# Patient Record
Sex: Female | Born: 1990 | Race: White | Hispanic: No | Marital: Single | Smoking: Former smoker
Health system: Southern US, Community
[De-identification: ages and names within clinical notes are randomized; demographics above are authoritative.]

## PROBLEM LIST (undated history)

## (undated) ENCOUNTER — Inpatient Hospital Stay (HOSPITAL_COMMUNITY): Payer: Self-pay

## (undated) DIAGNOSIS — Z789 Other specified health status: Secondary | ICD-10-CM

## (undated) HISTORY — PX: NO PAST SURGERIES: SHX2092

---
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
## 2008-05-30 ENCOUNTER — Emergency Department (HOSPITAL_COMMUNITY): Admission: EM | Admit: 2008-05-30 | Discharge: 2008-05-30 | Payer: Self-pay | Admitting: Emergency Medicine

## 2009-04-13 ENCOUNTER — Emergency Department (HOSPITAL_COMMUNITY): Admission: EM | Admit: 2009-04-13 | Discharge: 2009-04-13 | Payer: Self-pay | Admitting: Emergency Medicine

## 2010-01-03 ENCOUNTER — Emergency Department (HOSPITAL_COMMUNITY): Admission: EM | Admit: 2010-01-03 | Discharge: 2010-01-03 | Payer: Self-pay | Admitting: Emergency Medicine

## 2010-09-02 ENCOUNTER — Emergency Department (HOSPITAL_COMMUNITY)
Admission: EM | Admit: 2010-09-02 | Discharge: 2010-09-02 | Disposition: A | Discharge status: Home / Self Care | Payer: Medicaid Other | Attending: Emergency Medicine | Admitting: Emergency Medicine

## 2010-09-02 ENCOUNTER — Emergency Department (HOSPITAL_COMMUNITY): Payer: Medicaid Other

## 2010-09-02 DIAGNOSIS — R109 Unspecified abdominal pain: Secondary | ICD-10-CM | POA: Insufficient documentation

## 2010-09-02 DIAGNOSIS — O99891 Other specified diseases and conditions complicating pregnancy: Secondary | ICD-10-CM | POA: Insufficient documentation

## 2010-09-02 DIAGNOSIS — R110 Nausea: Secondary | ICD-10-CM | POA: Insufficient documentation

## 2010-09-02 LAB — URINALYSIS, ROUTINE W REFLEX MICROSCOPIC
Nitrite: NEGATIVE
Specific Gravity, Urine: 1.025 (ref 1.005–1.030)
pH: 5 (ref 5.0–8.0)

## 2010-09-02 LAB — WET PREP, GENITAL
Trich, Wet Prep: NONE SEEN
Yeast Wet Prep HPF POC: NONE SEEN

## 2010-09-02 LAB — PREGNANCY, URINE

## 2010-10-16 ENCOUNTER — Emergency Department (HOSPITAL_COMMUNITY)
Admission: EM | Admit: 2010-10-16 | Discharge: 2010-10-16 | Disposition: A | Discharge status: Home / Self Care | Payer: Medicaid Other | Attending: Emergency Medicine | Admitting: Emergency Medicine

## 2010-10-16 DIAGNOSIS — O9989 Other specified diseases and conditions complicating pregnancy, childbirth and the puerperium: Secondary | ICD-10-CM | POA: Insufficient documentation

## 2010-10-16 DIAGNOSIS — J45909 Unspecified asthma, uncomplicated: Secondary | ICD-10-CM | POA: Insufficient documentation

## 2010-10-16 DIAGNOSIS — R1084 Generalized abdominal pain: Secondary | ICD-10-CM | POA: Insufficient documentation

## 2010-10-16 DIAGNOSIS — R110 Nausea: Secondary | ICD-10-CM | POA: Insufficient documentation

## 2010-10-16 LAB — URINALYSIS, ROUTINE W REFLEX MICROSCOPIC
Glucose, UA: NEGATIVE mg/dL
Protein, ur: NEGATIVE mg/dL
pH: 5.5 (ref 5.0–8.0)

## 2011-03-30 ENCOUNTER — Other Ambulatory Visit: Payer: Self-pay | Admitting: Obstetrics and Gynecology

## 2011-03-30 ENCOUNTER — Encounter (HOSPITAL_COMMUNITY): Payer: Self-pay | Admitting: *Deleted

## 2011-03-30 ENCOUNTER — Inpatient Hospital Stay (HOSPITAL_COMMUNITY)
Admission: AD | Admit: 2011-03-30 | Discharge: 2011-04-02 | DRG: 775 | Disposition: A | Discharge status: Home / Self Care | Payer: Medicaid Other | Source: Ambulatory Visit | Attending: Obstetrics and Gynecology | Admitting: Obstetrics and Gynecology

## 2011-03-30 DIAGNOSIS — O4290 Premature rupture of membranes, unspecified as to length of time between rupture and onset of labor, unspecified weeks of gestation: Principal | ICD-10-CM | POA: Diagnosis present

## 2011-03-30 DIAGNOSIS — O42919 Preterm premature rupture of membranes, unspecified as to length of time between rupture and onset of labor, unspecified trimester: Secondary | ICD-10-CM

## 2011-03-30 HISTORY — DX: Other specified health status: Z78.9

## 2011-03-30 LAB — CBC
Platelets: 488 10*3/uL — ABNORMAL HIGH (ref 150–400)
RBC: 3.7 MIL/uL — ABNORMAL LOW (ref 3.87–5.11)
WBC: 17.8 10*3/uL — ABNORMAL HIGH (ref 4.0–10.5)

## 2011-03-30 LAB — HEPATITIS B SURFACE ANTIGEN: Hepatitis B Surface Ag: NEGATIVE

## 2011-03-30 LAB — HIV ANTIBODY (ROUTINE TESTING W REFLEX): HIV: NONREACTIVE

## 2011-03-30 LAB — ABO/RH: RH Type: POSITIVE

## 2011-03-30 MED ORDER — EPHEDRINE 5 MG/ML INJ: 10.0000 mg | INTRAVENOUS | Status: DC | PRN

## 2011-03-30 MED ORDER — PHENYLEPHRINE 40 MCG/ML (10ML) SYRINGE FOR IV PUSH (FOR BLOOD PRESSURE SUPPORT): 80.0000 ug | PREFILLED_SYRINGE | INTRAVENOUS | Status: DC | PRN

## 2011-03-30 MED ORDER — ONDANSETRON HCL 4 MG/2ML IJ SOLN: 4.0000 mg | Freq: Four times a day (QID) | INTRAMUSCULAR | Status: DC | PRN

## 2011-03-30 MED ORDER — OXYCODONE-ACETAMINOPHEN 5-325 MG PO TABS: 2.0000 | ORAL_TABLET | ORAL | Status: DC | PRN

## 2011-03-30 MED ORDER — LACTATED RINGERS IV SOLN
INTRAVENOUS | Status: DC
  Administered 2011-03-30 – 2011-03-31 (×3): via INTRAVENOUS

## 2011-03-30 MED ORDER — PENICILLIN G POTASSIUM 5000000 UNITS IJ SOLR
5.0000 10*6.[IU] | Freq: Once | INTRAVENOUS | Status: AC
  Administered 2011-03-30: 5 10*6.[IU] via INTRAVENOUS
  Filled 2011-03-30: qty 5

## 2011-03-30 MED ORDER — EPHEDRINE 5 MG/ML INJ
10.0000 mg | INTRAVENOUS | Status: DC | PRN
  Filled 2011-03-30: qty 4

## 2011-03-30 MED ORDER — OXYTOCIN BOLUS FROM INFUSION
500.0000 mL | Freq: Once | INTRAVENOUS | Status: DC
  Filled 2011-03-30: qty 500

## 2011-03-30 MED ORDER — PHENYLEPHRINE 40 MCG/ML (10ML) SYRINGE FOR IV PUSH (FOR BLOOD PRESSURE SUPPORT)
80.0000 ug | PREFILLED_SYRINGE | INTRAVENOUS | Status: DC | PRN
  Filled 2011-03-30: qty 5

## 2011-03-30 MED ORDER — TERBUTALINE SULFATE 1 MG/ML IJ SOLN: 0.2500 mg | Freq: Once | INTRAMUSCULAR | Status: AC | PRN

## 2011-03-30 MED ORDER — LIDOCAINE HCL (PF) 1 % IJ SOLN
30.0000 mL | INTRAMUSCULAR | Status: DC | PRN
  Filled 2011-03-30: qty 30

## 2011-03-30 MED ORDER — OXYTOCIN 20 UNITS IN LACTATED RINGERS INFUSION - SIMPLE
125.0000 mL/h | Freq: Once | INTRAVENOUS | Status: AC
  Administered 2011-03-31: 999 mL/h via INTRAVENOUS

## 2011-03-30 MED ORDER — OXYTOCIN 20 UNITS IN LACTATED RINGERS INFUSION - SIMPLE
1.0000 m[IU]/min | INTRAVENOUS | Status: DC
  Administered 2011-03-30: 3 m[IU]/min via INTRAVENOUS
  Administered 2011-03-30: 1 m[IU]/min via INTRAVENOUS
  Filled 2011-03-30: qty 1000

## 2011-03-30 MED ORDER — CITRIC ACID-SODIUM CITRATE 334-500 MG/5ML PO SOLN: 30.0000 mL | ORAL | Status: DC | PRN

## 2011-03-30 MED ORDER — ACETAMINOPHEN 325 MG PO TABS: 650.0000 mg | ORAL_TABLET | ORAL | Status: DC | PRN

## 2011-03-30 MED ORDER — FENTANYL 2.5 MCG/ML BUPIVACAINE 1/10 % EPIDURAL INFUSION (WH - ANES)
14.0000 mL/h | INTRAMUSCULAR | Status: DC
  Administered 2011-03-31 (×2): 14 mL/h via EPIDURAL
  Filled 2011-03-30 (×2): qty 60

## 2011-03-30 MED ORDER — DIPHENHYDRAMINE HCL 50 MG/ML IJ SOLN: 12.5000 mg | INTRAMUSCULAR | Status: DC | PRN

## 2011-03-30 MED ORDER — PENICILLIN G POTASSIUM 5000000 UNITS IJ SOLR
2.5000 10*6.[IU] | INTRAMUSCULAR | Status: DC
  Administered 2011-03-30 – 2011-03-31 (×2): 2.5 10*6.[IU] via INTRAVENOUS
  Filled 2011-03-30 (×6): qty 2.5

## 2011-03-30 MED ORDER — LACTATED RINGERS IV SOLN
500.0000 mL | INTRAVENOUS | Status: DC | PRN
  Administered 2011-03-30: 500 mL via INTRAVENOUS

## 2011-03-30 MED ORDER — LACTATED RINGERS IV SOLN
500.0000 mL | Freq: Once | INTRAVENOUS | Status: AC
  Administered 2011-03-31: 500 mL via INTRAVENOUS

## 2011-03-30 MED ORDER — IBUPROFEN 600 MG PO TABS: 600.0000 mg | ORAL_TABLET | Freq: Four times a day (QID) | ORAL | Status: DC | PRN

## 2011-03-30 NOTE — Consult Note (Addendum)
Asked to provide prenatal consultation for patient being induced at 35.[redacted] wks EGA after onset preterm labor and PROM earlier today. Mother is 22 y.o. G2 P0 O pos GBS unknown, pregnancy previously uncomplicated. She is being treated with PCN (for unknown GBS).  Discussed with father of baby and several other family members/visitors present.  Presented usual expectations for preterm infant at 35 weeks, including possible need for NICU support, but also possibility for routine well baby care if no problems with breathing, feeding, temp stability, etc.  She does not plan to breast feed.  If NICU needed I predicted possible length of stay in NICU until 36 - [redacted] wks EGA.  Patient was attentive, had appropriate questions, and was appreciative of my input.  Thank you for the consultation.

## 2011-03-30 NOTE — H&P (Signed)
Abigail Morgan is a 20 y.o. female, G2 P0010, EGA [redacted] weeks presenting for leaking fluid.  Started leaking around 1300 today.  Evaluation in the office with + pool, + nitrazine, + fern.  Prenatal care complicated by chlamydia x 2, last rxed 10-15.  See prenatal records for complete history.  Maternal Medical History:  Reason for admission: Reason for admission: rupture of membranes.  Contractions: Frequency: irregular.    Fetal activity: Perceived fetal activity is normal.      OB History    Grav Para Term Preterm Abortions TAB SAB Ect Mult Living   2 0 0 0 1 1 0 0 0 0      Past Medical History  Diagnosis Date  . No pertinent past medical history    Past Surgical History  Procedure Date  . No past surgeries    Family History: family history is not on file. Social History:  reports that she quit smoking about 4 weeks ago. She has never used smokeless tobacco. She reports that she does not drink alcohol or use illicit drugs.  Review of Systems  Respiratory: Negative.   Cardiovascular: Negative.    VE-1-2/50/-2, vtx by RN, c/w my exam in the office Dilation:  (adjusted) Effacement (%): 50 Station: -2 Exam by:: m wilkins rnc Blood pressure 103/51, pulse 83, temperature 98 F (36.7 C), temperature source Oral, resp. rate 20, height 5' (1.524 m), weight 58.514 kg (129 lb), last menstrual period 07/25/2010. Maternal Exam:  Uterine Assessment: Contraction strength is moderate.  Contraction frequency is regular.   Abdomen: Patient reports no abdominal tenderness. Estimated fetal weight is 5 lbs.   Fetal presentation: vertex  Introitus: Normal vulva. Normal vagina.    Fetal Exam Fetal Monitor Review: Mode: ultrasound.   Baseline rate: 140s.  Variability: moderate (6-25 bpm).   Pattern: accelerations present and variable decelerations.    Fetal State Assessment: Category II - tracings are indeterminate.     Physical Exam  Constitutional: She appears well-developed and  well-nourished.  Neck: Neck supple. No thyromegaly present.  Cardiovascular: Normal rate and regular rhythm.   Murmur: II/VI systolic murmur. Respiratory: Breath sounds normal. No respiratory distress. She has no wheezes.  GI: Soft.    Prenatal labs: ABO, Rh: O/Positive/-- (11/07 0000) Antibody: Negative (11/07 0000) Rubella: Immune (11/07 0000) RPR: Nonreactive (11/07 0000)  HBsAg: Negative (11/07 0000)  HIV: Non-reactive (11/07 0000)  GBS: Unknown (11/07 0000)   Assessment/Plan: IUP at 35 weeks with PPROM.  On pitocin, on PCN for unknown GBS.  Will monitor progress.   Nicolaus Andel D 03/30/2011, 6:15 PM

## 2011-03-31 ENCOUNTER — Encounter (HOSPITAL_COMMUNITY): Payer: Self-pay

## 2011-03-31 ENCOUNTER — Encounter (HOSPITAL_COMMUNITY): Payer: Self-pay | Admitting: Anesthesiology

## 2011-03-31 ENCOUNTER — Inpatient Hospital Stay (HOSPITAL_COMMUNITY): Payer: Medicaid Other | Admitting: Anesthesiology

## 2011-03-31 ENCOUNTER — Other Ambulatory Visit: Payer: Self-pay | Admitting: Obstetrics and Gynecology

## 2011-03-31 LAB — RPR: RPR Ser Ql: NONREACTIVE

## 2011-03-31 LAB — ABO/RH: ABO/RH(D): O POS

## 2011-03-31 MED ORDER — WITCH HAZEL-GLYCERIN EX PADS: 1.0000 "application " | MEDICATED_PAD | CUTANEOUS | Status: DC | PRN

## 2011-03-31 MED ORDER — METHYLERGONOVINE MALEATE 0.2 MG PO TABS: 0.2000 mg | ORAL_TABLET | ORAL | Status: DC | PRN

## 2011-03-31 MED ORDER — IBUPROFEN 600 MG PO TABS
600.0000 mg | ORAL_TABLET | Freq: Four times a day (QID) | ORAL | Status: DC
  Administered 2011-03-31 – 2011-04-02 (×8): 600 mg via ORAL
  Filled 2011-03-31 (×8): qty 1

## 2011-03-31 MED ORDER — LIDOCAINE HCL 1.5 % IJ SOLN
INTRAMUSCULAR | Status: DC | PRN
  Administered 2011-03-31 (×2): 5 mL via EPIDURAL

## 2011-03-31 MED ORDER — BENZOCAINE-MENTHOL 20-0.5 % EX AERO: 1.0000 "application " | INHALATION_SPRAY | CUTANEOUS | Status: DC | PRN

## 2011-03-31 MED ORDER — SENNOSIDES-DOCUSATE SODIUM 8.6-50 MG PO TABS
2.0000 | ORAL_TABLET | Freq: Every day | ORAL | Status: DC
  Administered 2011-03-31 – 2011-04-01 (×2): 2 via ORAL

## 2011-03-31 MED ORDER — ONDANSETRON HCL 4 MG/2ML IJ SOLN: 4.0000 mg | INTRAMUSCULAR | Status: DC | PRN

## 2011-03-31 MED ORDER — ZOLPIDEM TARTRATE 5 MG PO TABS: 5.0000 mg | ORAL_TABLET | Freq: Every evening | ORAL | Status: DC | PRN

## 2011-03-31 MED ORDER — TETANUS-DIPHTH-ACELL PERTUSSIS 5-2.5-18.5 LF-MCG/0.5 IM SUSP
0.5000 mL | Freq: Once | INTRAMUSCULAR | Status: AC
  Administered 2011-04-01: 0.5 mL via INTRAMUSCULAR
  Filled 2011-03-31: qty 0.5

## 2011-03-31 MED ORDER — MEASLES, MUMPS & RUBELLA VAC ~~LOC~~ INJ
0.5000 mL | INJECTION | Freq: Once | SUBCUTANEOUS | Status: DC
  Filled 2011-03-31: qty 0.5

## 2011-03-31 MED ORDER — METHYLERGONOVINE MALEATE 0.2 MG/ML IJ SOLN: 0.2000 mg | INTRAMUSCULAR | Status: DC | PRN

## 2011-03-31 MED ORDER — OXYCODONE-ACETAMINOPHEN 5-325 MG PO TABS
1.0000 | ORAL_TABLET | ORAL | Status: DC | PRN
Start: 2011-03-31 — End: 2011-04-02
  Administered 2011-03-31: 1 via ORAL
  Administered 2011-04-01 (×2): 2 via ORAL
  Administered 2011-04-01: 1 via ORAL
  Filled 2011-03-31: qty 2
  Filled 2011-03-31 (×2): qty 1
  Filled 2011-03-31: qty 2

## 2011-03-31 MED ORDER — DIBUCAINE 1 % RE OINT: 1.0000 "application " | TOPICAL_OINTMENT | RECTAL | Status: DC | PRN

## 2011-03-31 MED ORDER — PRENATAL PLUS 27-1 MG PO TABS
1.0000 | ORAL_TABLET | Freq: Every day | ORAL | Status: DC
  Administered 2011-04-01: 1 via ORAL
  Filled 2011-03-31: qty 1

## 2011-03-31 MED ORDER — DIPHENHYDRAMINE HCL 25 MG PO CAPS: 25.0000 mg | ORAL_CAPSULE | Freq: Four times a day (QID) | ORAL | Status: DC | PRN

## 2011-03-31 MED ORDER — ONDANSETRON HCL 4 MG PO TABS: 4.0000 mg | ORAL_TABLET | ORAL | Status: DC | PRN

## 2011-03-31 MED ORDER — SIMETHICONE 80 MG PO CHEW: 80.0000 mg | CHEWABLE_TABLET | ORAL | Status: DC | PRN

## 2011-03-31 MED ORDER — MAGNESIUM HYDROXIDE 400 MG/5ML PO SUSP: 30.0000 mL | ORAL | Status: DC | PRN

## 2011-03-31 MED ORDER — OXYTOCIN 20 UNITS IN LACTATED RINGERS INFUSION - SIMPLE: 125.0000 mL/h | INTRAVENOUS | Status: DC | PRN

## 2011-03-31 MED ORDER — LANOLIN HYDROUS EX OINT: TOPICAL_OINTMENT | CUTANEOUS | Status: DC | PRN

## 2011-03-31 NOTE — Anesthesia Postprocedure Evaluation (Signed)
  Anesthesia Post-op Note  Patient: Abigail Morgan  Procedure(s) Performed: * No procedures listed *  Patient Location: Mother/Baby  Anesthesia Type: Epidural  Level of Consciousness: awake  Airway and Oxygen Therapy: Patient Spontanous Breathing  Post-op Pain: none  Post-op Assessment: Patient's Cardiovascular Status Stable, Respiratory Function Stable, Patent Airway, No signs of Nausea or vomiting, Adequate PO intake and Pain level controlled  Post-op Vital Signs: Reviewed and stable  Complications: No apparent anesthesia complications

## 2011-03-31 NOTE — Op Note (Signed)
Pt pushed well and brought the vertex down. She delivered spontaneously ROA over an intact perineum a living female infant 5 pounds 5 ounces. The NICU team was in attendance but did not state the Apgars. The placenta was delivered intact after cord blood collection was done. The uterus was normal There was no perineal laceration but there was a right labial laceration that was hemostatic and not repaired.Marland KitchenEBL 400 cc's.

## 2011-03-31 NOTE — Consult Note (Cosign Needed)
Called to attend 35 2/7 week delivery, MOB ruptured 20 hours with unknown GBS, received antibiotic therapy. Mom 20 y.o. female, G2 P51, labor was augmented.  Vaginal delivery in vertex position. The baby was active and crying when delivered to the team.  Dried and bulb suctioned, no O2 required. Taken to [facility] after 5 minute APGAR assigned for routine newborn care.

## 2011-03-31 NOTE — Progress Notes (Signed)
Pt was 3 cm at 6 AM and at 7:06 AM WAS 8-9 CM PER THE RN  Exam She was fully dilated at 7:20 AM and has begun pushing.Vertex is visible with depressing the perineum.

## 2011-03-31 NOTE — Anesthesia Postprocedure Evaluation (Signed)
  Anesthesia Post-op Note  Patient: Abigail Morgan  Procedure(s) Performed: * No procedures listed *  Patient Location: Mother/Baby  Anesthesia Type: Epidural  Level of Consciousness: awake  Airway and Oxygen Therapy: Patient Spontanous Breathing  Post-op Pain: none  Post-op Assessment: Patient's Cardiovascular Status Stable, Respiratory Function Stable, Adequate PO intake and Pain level controlled  Post-op Vital Signs: Reviewed and stable  Complications: No apparent anesthesia complications

## 2011-03-31 NOTE — Anesthesia Procedure Notes (Signed)
Epidural Patient location during procedure: OB Start time: 03/31/2011 2:48 AM  Staffing Performed by: anesthesiologist   Preanesthetic Checklist Completed: patient identified, site marked, surgical consent, pre-op evaluation, timeout performed, IV checked, risks and benefits discussed and monitors and equipment checked  Epidural Patient position: sitting Prep: site prepped and draped and DuraPrep Patient monitoring: continuous pulse ox and blood pressure Approach: midline Injection technique: LOR air and LOR saline  Needle:  Needle type: Tuohy  Needle gauge: 17 G Needle length: 9 cm Needle insertion depth: 5 cm cm Catheter type: closed end flexible Catheter size: 19 Gauge Catheter at skin depth: 10 cm Test dose: negative  Assessment Events: blood not aspirated, injection not painful, no injection resistance, negative IV test and no paresthesia  Additional Notes Patient identified.  Risk benefits discussed including failed block, incomplete pain control, headache, nerve damage, paralysis, blood pressure changes, nausea, vomiting, reactions to medication both toxic or allergic, and postpartum back pain.  Patient expressed understanding and wished to proceed.  All questions were answered.  Sterile technique used throughout procedure and epidural site dressed with sterile barrier dressing. No paresthesia or other complications noted.The patient did not experience any signs of intravascular injection such as tinnitus or metallic taste in mouth nor signs of intrathecal spread such as rapid motor block. Please see nursing notes for vital signs.

## 2011-03-31 NOTE — Progress Notes (Signed)
UR chart review completed.  

## 2011-03-31 NOTE — Anesthesia Preprocedure Evaluation (Signed)

## 2011-04-01 LAB — CBC
HCT: 29.1 % — ABNORMAL LOW (ref 36.0–46.0)
Hemoglobin: 9.5 g/dL — ABNORMAL LOW (ref 12.0–15.0)
MCHC: 32.6 g/dL (ref 30.0–36.0)
MCV: 88.4 fL (ref 78.0–100.0)
RDW: 13.7 % (ref 11.5–15.5)

## 2011-04-01 NOTE — Progress Notes (Signed)
#  1 afebrile BP normal HGB stable no complaints

## 2011-04-02 MED ORDER — OXYCODONE-ACETAMINOPHEN 5-325 MG PO TABS: 1.0000 | ORAL_TABLET | ORAL | Status: AC | PRN

## 2011-04-02 MED ORDER — IBUPROFEN 600 MG PO TABS: 600.0000 mg | ORAL_TABLET | Freq: Four times a day (QID) | ORAL | Status: AC

## 2011-04-02 NOTE — Progress Notes (Signed)
Post Partum Day 2 Subjective: no complaints and tolerating PO  Objective: Blood pressure 103/67, pulse 55, temperature 97.8 F (36.6 C), temperature source Oral, resp. rate 18, height 5' (1.524 m), weight 58.514 kg (129 lb), last menstrual period 07/25/2010, SpO2 99.00%, unknown if currently breastfeeding.  Physical Exam:  General: alert Lochia: appropriate Uterine Fundus: firm    Basename 04/01/11 0535 03/30/11 1558  HGB 9.5* 11.0*  HCT 29.1* 32.2*    Assessment/Plan: Discharge home Motrin, percocet F/u 6 weeks Plans Mirena   LOS: 3 days   Abigail Morgan W 04/02/2011, 8:11 AM

## 2011-04-02 NOTE — Discharge Summary (Signed)
Obstetric Discharge Summary Reason for Admission: rupture of membranes Prenatal Procedures: none Intrapartum Procedures: spontaneous vaginal delivery Postpartum Procedures: none Complications-Operative and Postpartum: right labial not requiring repair Hemoglobin  Date Value Range Status  04/01/2011 9.5* 12.0-15.0 (g/dL) Final     HCT  Date Value Range Status  04/01/2011 29.1* 36.0-46.0 (%) Final    Discharge Diagnoses: Preterm pregnancy at 35+ weeks delivered  Discharge Information: Date: 04/02/2011 Activity: pelvic rest Diet: routine Medications: Ibuprofen and Percocet Condition: stable Instructions: refer to practice specific booklet Discharge to: home Follow-up Information    Follow up with Bing Plume, MD. Make an appointment in 6 weeks.   Contact information:   [employer] [employer]. [address] [place], [address] [place] [address] [phone]          Newborn Data: Live born female  Birth Weight: 5 lb 5.2 oz (2415 g) APGAR: 8, 9  Home with mother.  Abigail Morgan 04/02/2011, 8:15 AM

## 2011-07-30 ENCOUNTER — Encounter (HOSPITAL_COMMUNITY): Payer: Self-pay | Admitting: Emergency Medicine

## 2011-07-30 ENCOUNTER — Emergency Department (HOSPITAL_COMMUNITY)
Admission: EM | Admit: 2011-07-30 | Discharge: 2011-07-30 | Disposition: A | Discharge status: Home / Self Care | Payer: Medicaid Other | Attending: Emergency Medicine | Admitting: Emergency Medicine

## 2011-07-30 DIAGNOSIS — R52 Pain, unspecified: Secondary | ICD-10-CM

## 2011-07-30 DIAGNOSIS — R509 Fever, unspecified: Secondary | ICD-10-CM | POA: Insufficient documentation

## 2011-07-30 DIAGNOSIS — B349 Viral infection, unspecified: Secondary | ICD-10-CM

## 2011-07-30 DIAGNOSIS — B9789 Other viral agents as the cause of diseases classified elsewhere: Secondary | ICD-10-CM | POA: Insufficient documentation

## 2011-07-30 MED ORDER — HYDROCODONE-ACETAMINOPHEN 5-325 MG PO TABS: 1.0000 | ORAL_TABLET | Freq: Four times a day (QID) | ORAL | Status: AC | PRN

## 2011-07-30 MED ORDER — KETOROLAC TROMETHAMINE 30 MG/ML IJ SOLN
30.0000 mg | Freq: Once | INTRAMUSCULAR | Status: AC
  Administered 2011-07-30: 30 mg via INTRAVENOUS
  Filled 2011-07-30: qty 1

## 2011-07-30 MED ORDER — SODIUM CHLORIDE 0.9 % IV BOLUS (SEPSIS)
1000.0000 mL | Freq: Once | INTRAVENOUS | Status: AC
  Administered 2011-07-30: 1000 mL via INTRAVENOUS

## 2011-07-30 MED ORDER — IBUPROFEN 800 MG PO TABS: 800.0000 mg | ORAL_TABLET | Freq: Three times a day (TID) | ORAL | Status: AC | PRN

## 2011-07-30 NOTE — Discharge Instructions (Signed)
Increase your fluid intake.  Return here for any worsening in your condition. Rest as much as possible.

## 2011-07-30 NOTE — ED Provider Notes (Signed)
History     CSN: [id number]  Arrival date & time 07/30/11  0941   First MD Initiated Contact with Patient 07/30/11 1017      Chief Complaint  Patient presents with  . Fever    pt reports 24 hr hx of fever   . Generalized Body Aches    24 hr of "flu" symptoms    (Consider location/radiation/quality/duration/timing/severity/associated sxs/prior treatment) HPI Patient is an emergency department with generalized bodyaches for the last 24 hours.  States she also felt chilled and feverish.  Patient denies chest pain, shortness breath, cough, weakness, vomiting/nausea, nasal congestion, sore throat, abdominal pain, diarrhea, visual changes, or dysuria.  States she does have some headache associated with her symptoms.  She states she has taken Tylenol and ibuprofen for the fever and headache.    Past Medical History  Diagnosis Date  . No pertinent past medical history   . Asthma     Past Surgical History  Procedure Date  . No past surgeries     History reviewed. No pertinent family history.  History  Substance Use Topics  . Smoking status: Former Smoker    Quit date: 02/27/2011  . Smokeless tobacco: Never Used  . Alcohol Use: No    OB History    Grav Para Term Preterm Abortions TAB SAB Ect Mult Living   2 1 0 1 1 1 0 0 0 1       Review of Systems All pertinent positives and negatives reviewed in the history of present illness  Allergies  Review of patient's allergies indicates no known allergies.  Home Medications   Current Outpatient Rx  Name Route Sig Dispense Refill  . ACETAMINOPHEN 500 MG PO TABS Oral Take 1,000 mg by mouth every 6 (six) hours as needed. For pain/fever.    . IBUPROFEN 200 MG PO TABS Oral Take 400 mg by mouth every 8 (eight) hours as needed. For pain/fever.      BP 92/65  Pulse 98  Temp 99.7 F (37.6 C)  Resp 18  Wt 110 lb (49.896 kg)  SpO2 99%  LMP 07/16/2011  Breastfeeding? No  Physical Exam  Constitutional: She is oriented to  person, place, and time. She appears well-developed and well-nourished. No distress.  HENT:  Head: Normocephalic and atraumatic. No trismus in the jaw.  Right Ear: Tympanic membrane normal.  Left Ear: Tympanic membrane normal.  Nose: Nose normal. No mucosal edema or rhinorrhea.  Mouth/Throat: Uvula is midline, oropharynx is clear and moist and mucous membranes are normal. No uvula swelling. No oropharyngeal exudate, posterior oropharyngeal edema, posterior oropharyngeal erythema or tonsillar abscesses.  Eyes: Pupils are equal, round, and reactive to light.  Neck: Trachea normal and normal range of motion. Neck supple. No spinous process tenderness and no muscular tenderness present. No rigidity. Normal range of motion present.  Cardiovascular: Normal rate, regular rhythm and normal heart sounds.  Exam reveals no gallop and no friction rub.   No murmur heard. Pulmonary/Chest: Effort normal and breath sounds normal. No respiratory distress. She has no wheezes. She has no rales.  Abdominal: Soft. Bowel sounds are normal. She exhibits no distension. There is no tenderness.  Neurological: She is alert and oriented to person, place, and time.    ED Course  Procedures (including critical care time)   Patient was given of IV fluid bolus.  She given medication for her headache as well.  Patient is advised to return here for any worsening in her condition. this appears to  be a viral type illness without any further symptoms other than bodyaches and chills.  Patient showed no signs of meningeal symptoms.  Patient is very well appearing on exam and it distended up to the laboratory testing was needed.  She is advised to rest and increase her fluid intake.       MDM          Carlyle Dolly, PA-C 07/30/11 1256

## 2011-08-01 NOTE — ED Provider Notes (Signed)
Medical screening examination/treatment/procedure(s) were performed by non-physician practitioner and as supervising physician I was immediately available for consultation/collaboration.   Laray Anger, DO 08/01/11 1107

## 2012-01-24 ENCOUNTER — Emergency Department (HOSPITAL_COMMUNITY): Payer: Medicaid Other

## 2012-01-24 ENCOUNTER — Emergency Department (HOSPITAL_COMMUNITY)
Admission: EM | Admit: 2012-01-24 | Discharge: 2012-01-24 | Disposition: A | Discharge status: Home / Self Care | Payer: Medicaid Other | Attending: Emergency Medicine | Admitting: Emergency Medicine

## 2012-01-24 ENCOUNTER — Encounter (HOSPITAL_COMMUNITY): Payer: Self-pay

## 2012-01-24 DIAGNOSIS — O99891 Other specified diseases and conditions complicating pregnancy: Secondary | ICD-10-CM | POA: Insufficient documentation

## 2012-01-24 DIAGNOSIS — F17200 Nicotine dependence, unspecified, uncomplicated: Secondary | ICD-10-CM | POA: Insufficient documentation

## 2012-01-24 DIAGNOSIS — R109 Unspecified abdominal pain: Secondary | ICD-10-CM | POA: Insufficient documentation

## 2012-01-24 DIAGNOSIS — R82998 Other abnormal findings in urine: Secondary | ICD-10-CM | POA: Insufficient documentation

## 2012-01-24 DIAGNOSIS — Z3490 Encounter for supervision of normal pregnancy, unspecified, unspecified trimester: Secondary | ICD-10-CM

## 2012-01-24 DIAGNOSIS — J45909 Unspecified asthma, uncomplicated: Secondary | ICD-10-CM | POA: Insufficient documentation

## 2012-01-24 LAB — URINALYSIS, ROUTINE W REFLEX MICROSCOPIC
Bilirubin Urine: NEGATIVE
Glucose, UA: NEGATIVE mg/dL
Hgb urine dipstick: NEGATIVE
Protein, ur: NEGATIVE mg/dL

## 2012-01-24 LAB — PREGNANCY, URINE: Preg Test, Ur: POSITIVE — AB

## 2012-01-24 LAB — URINE MICROSCOPIC-ADD ON

## 2012-01-24 LAB — RPR: RPR Ser Ql: NONREACTIVE

## 2012-01-24 MED ORDER — NITROFURANTOIN MONOHYD MACRO 100 MG PO CAPS
100.0000 mg | ORAL_CAPSULE | Freq: Once | ORAL | Status: DC
  Filled 2012-01-24: qty 1

## 2012-01-24 MED ORDER — PRENATAL COMPLETE 14-0.4 MG PO TABS: 1.0000 | ORAL_TABLET | ORAL | Status: DC

## 2012-01-24 MED ORDER — NITROFURANTOIN MONOHYD MACRO 100 MG PO CAPS: 100.0000 mg | ORAL_CAPSULE | Freq: Two times a day (BID) | ORAL | Status: AC

## 2012-01-24 NOTE — ED Notes (Signed)
Pt reports- unsure of how far along she is- ? 4 months, has not had a period since January.  Pt has had pregnancy in the past.  Was placed on birth control 5 weeks ago.

## 2012-01-24 NOTE — ED Provider Notes (Addendum)
History     CSN: [id number]  Arrival date & time 01/24/12  4098   First MD Initiated Contact with Patient 01/24/12 [phone]      Chief Complaint  Patient presents with  . Abdominal Pain    (Consider location/radiation/quality/duration/timing/severity/associated sxs/prior treatment) HPI Complains of low abdominal pain intermittent onset last night lasting 5 minutes at a time said several episodes since last night presently having pain, all episodes were mild. Nonradiating, sharp in quality. No treatment prior to coming here nothing makes symptoms better or worse no vaginal discharge no vaginal bleeding. Patient presents with pregnant last menstrual period January 2013 however she is uncertain of duration of pregnancy as she's been on birth control since January. Denies anorexia denies fever denies urinary symptoms no other associated symptoms the Past Medical History  Diagnosis Date  . No pertinent past medical history   . Asthma    past OB/GYN history gravida 3 para 0110, 1 preterm delivery one therapeutic abortion  Past Surgical History  Procedure Date  . No past surgeries     No family history on file.  History  Substance Use Topics  . Smoking status: Current Everyday Smoker    Last Attempt to Quit: 02/27/2011  . Smokeless tobacco: Never Used  . Alcohol Use: No    OB History    Grav Para Term Preterm Abortions TAB SAB Ect Mult Living   2 1 0 1 1 1 0 0 0 1       Review of Systems  Gastrointestinal: Positive for abdominal pain.    Allergies  Review of patient's allergies indicates no known allergies.  Home Medications   Current Outpatient Rx  Name Route Sig Dispense Refill  . ACETAMINOPHEN 500 MG PO TABS Oral Take 1,000 mg by mouth every 6 (six) hours as needed. For pain/fever.      BP 100/59  Pulse 70  Temp 98.1 F (36.7 C)  Resp 20  SpO2 99%  LMP 05/26/2011  Physical Exam  Nursing note and vitals reviewed. Constitutional: She appears well-developed and  well-nourished.  HENT:  Head: Normocephalic and atraumatic.  Eyes: Conjunctivae are normal. Pupils are equal, round, and reactive to light.  Neck: Neck supple. No tracheal deviation present. No thyromegaly present.  Cardiovascular: Normal rate and regular rhythm.   No murmur heard. Pulmonary/Chest: Effort normal and breath sounds normal.  Abdominal: Soft. Bowel sounds are normal. She exhibits distension. She exhibits no mass. There is tenderness. There is no rebound and no guarding.       Minimal diffuse tenderness, gravid  Genitourinary:       Speculum exam performed only cervical os closed positive white vaginal discharge no external lesion no bleeding  Musculoskeletal: Normal range of motion. She exhibits no edema and no tenderness.  Neurological: She is alert. Coordination normal.  Skin: Skin is warm and dry. No rash noted.  Psychiatric: She has a normal mood and affect.    ED Course  Procedures (including critical care time)   Labs Reviewed  URINALYSIS, ROUTINE W REFLEX MICROSCOPIC  PREGNANCY, URINE   No results found. 2 PM patient alert states discomfort is very minimal at present. Has never felt like labor pains.  No diagnosis found.  Results for orders placed during the hospital encounter of 01/24/12  URINALYSIS, ROUTINE W REFLEX MICROSCOPIC      Component Value Range   Color, Urine YELLOW  YELLOW   APPearance CLOUDY (*) CLEAR   Specific Gravity, Urine 1.022  1.005 - 1.030  pH 6.5  5.0 - 8.0   Glucose, UA NEGATIVE  NEGATIVE mg/dL   Hgb urine dipstick NEGATIVE  NEGATIVE   Bilirubin Urine NEGATIVE  NEGATIVE   Ketones, ur NEGATIVE  NEGATIVE mg/dL   Protein, ur NEGATIVE  NEGATIVE mg/dL   Urobilinogen, UA 0.2  0.0 - 1.0 mg/dL   Nitrite NEGATIVE  NEGATIVE   Leukocytes, UA MODERATE (*) NEGATIVE  PREGNANCY, URINE      Component Value Range   Preg Test, Ur POSITIVE (*) NEGATIVE  URINE MICROSCOPIC-ADD ON      Component Value Range   Squamous Epithelial / LPF FEW (*)  RARE   WBC, UA 3-6  <3 WBC/hpf   Bacteria, UA MANY (*) RARE   Urine-Other MUCOUS PRESENT    WET PREP, GENITAL      Component Value Range   Yeast Wet Prep HPF POC NONE SEEN  NONE SEEN   Trich, Wet Prep NONE SEEN  NONE SEEN   Clue Cells Wet Prep HPF POC NONE SEEN  NONE SEEN   WBC, Wet Prep HPF POC FEW (*) NONE SEEN   US Ob Comp + 14 Wk  01/24/2012  *RADIOLOGY REPORT*  Clinical Data: Abdominal pain.  Positive pregnancy test.  LIMITED OBSTETRIC ULTRASOUND  Number of Fetuses: 1 Heart Rate: 162 bpm Movement: Present Presentation: Breech Placental Location: Fundal Previa: None Amniotic Fluid (Subjective): Normal.  BPD:  5.0 cm      21 w  1 d         EDC:  06/04/2012  MATERNAL FINDINGS: Cervix: Closed Uterus/Adnexae:  Within normal limits.  IMPRESSION: Single viable intrauterine pregnancy with estimated gestational age [redacted] weeks 1 day.  Recommend followup with non-emergent complete OB 14+ wk US examination for fetal biometric evaluation and anatomic survey if not already performed.   Original Report Authenticated By: Andreas Newport, M.D.      MDM  Plan urine sent for culture prescription Macrobid prescription by mouth vitamins smoking cessation encouraged. Spent 5 minutes councilling on smoking. Patient to arrange for prenatal care Diagnosis #1 nonspecific abdominal pain #2 intrauterine pregnancy #3Bacteriuria #4 tobacco abuse      Doug Sou, MD 01/24/12 1413  Doug Sou, MD 01/24/12 4098

## 2012-01-24 NOTE — ED Notes (Signed)
Ultrasound called regarding status of results, was told by tech that she would call Radiologist to see why report had not been read, Dr. Ethelda Chick informed, will monitor.

## 2012-01-24 NOTE — ED Notes (Signed)
Pt presents with no acute distress.  Positive pregnancy test taken at home- awoke this am with lower abdominal pain- denies N/V/D/urinary problems/vaginal problems and fever

## 2012-01-24 NOTE — ED Notes (Signed)
MD at bedside. 

## 2012-01-24 NOTE — ED Notes (Signed)
Pelvic cart placed at bedside.  

## 2012-01-24 NOTE — ED Notes (Signed)
Patient transported to Ultrasound 

## 2012-01-25 LAB — GC/CHLAMYDIA PROBE AMP, GENITAL: GC Probe Amp, Genital: NEGATIVE

## 2012-01-26 LAB — URINE CULTURE: Colony Count: 3000

## 2012-02-15 ENCOUNTER — Other Ambulatory Visit (HOSPITAL_COMMUNITY): Payer: Self-pay | Admitting: Obstetrics and Gynecology

## 2012-03-01 ENCOUNTER — Inpatient Hospital Stay (HOSPITAL_COMMUNITY): Payer: Medicaid Other

## 2012-03-01 ENCOUNTER — Encounter (HOSPITAL_COMMUNITY): Payer: Self-pay | Admitting: *Deleted

## 2012-03-01 ENCOUNTER — Inpatient Hospital Stay (HOSPITAL_COMMUNITY)
Admission: AD | Admit: 2012-03-01 | Discharge: 2012-03-04 | DRG: 775 | Disposition: A | Discharge status: Home / Self Care | Payer: Medicaid Other | Source: Ambulatory Visit | Attending: Obstetrics and Gynecology | Admitting: Obstetrics and Gynecology

## 2012-03-01 DIAGNOSIS — O4290 Premature rupture of membranes, unspecified as to length of time between rupture and onset of labor, unspecified weeks of gestation: Principal | ICD-10-CM | POA: Diagnosis present

## 2012-03-01 DIAGNOSIS — O42919 Preterm premature rupture of membranes, unspecified as to length of time between rupture and onset of labor, unspecified trimester: Secondary | ICD-10-CM

## 2012-03-01 LAB — CBC
MCH: 29.6 pg (ref 26.0–34.0)
MCV: 86.7 fL (ref 78.0–100.0)
Platelets: 322 10*3/uL (ref 150–400)
RDW: 13.6 % (ref 11.5–15.5)
WBC: 13.5 10*3/uL — ABNORMAL HIGH (ref 4.0–10.5)

## 2012-03-01 MED ORDER — PRENATAL MULTIVITAMIN CH
1.0000 | ORAL_TABLET | Freq: Every day | ORAL | Status: DC
  Administered 2012-03-02: 1 via ORAL
  Filled 2012-03-01: qty 1

## 2012-03-01 MED ORDER — AMOXICILLIN 500 MG PO CAPS
500.0000 mg | ORAL_CAPSULE | Freq: Three times a day (TID) | ORAL | Status: DC
  Filled 2012-03-01: qty 1

## 2012-03-01 MED ORDER — DOCUSATE SODIUM 100 MG PO CAPS
100.0000 mg | ORAL_CAPSULE | Freq: Every day | ORAL | Status: DC
  Administered 2012-03-02: 100 mg via ORAL
  Filled 2012-03-01: qty 1

## 2012-03-01 MED ORDER — ACETAMINOPHEN 325 MG PO TABS: 650.0000 mg | ORAL_TABLET | ORAL | Status: DC | PRN

## 2012-03-01 MED ORDER — LACTATED RINGERS IV SOLN
INTRAVENOUS | Status: DC
  Administered 2012-03-01 (×2): via INTRAVENOUS

## 2012-03-01 MED ORDER — ZOLPIDEM TARTRATE 5 MG PO TABS
5.0000 mg | ORAL_TABLET | Freq: Every evening | ORAL | Status: DC | PRN
  Administered 2012-03-01 – 2012-03-02 (×2): 5 mg via ORAL
  Filled 2012-03-01 (×2): qty 1

## 2012-03-01 MED ORDER — AZITHROMYCIN 500 MG PO TABS
500.0000 mg | ORAL_TABLET | Freq: Every day | ORAL | Status: DC
  Administered 2012-03-01 – 2012-03-02 (×2): 500 mg via ORAL
  Filled 2012-03-01 (×3): qty 1

## 2012-03-01 MED ORDER — MAGNESIUM SULFATE BOLUS VIA INFUSION
6.0000 g | Freq: Once | INTRAVENOUS | Status: AC
  Administered 2012-03-01: 6 g via INTRAVENOUS
  Filled 2012-03-01: qty 500

## 2012-03-01 MED ORDER — CALCIUM CARBONATE ANTACID 500 MG PO CHEW: 2.0000 | CHEWABLE_TABLET | ORAL | Status: DC | PRN

## 2012-03-01 MED ORDER — MAGNESIUM SULFATE 40 G IN LACTATED RINGERS - SIMPLE
2.0000 g/h | INTRAVENOUS | Status: DC
  Filled 2012-03-01: qty 500

## 2012-03-01 MED ORDER — BETAMETHASONE SOD PHOS & ACET 6 (3-3) MG/ML IJ SUSP
12.0000 mg | INTRAMUSCULAR | Status: AC
  Administered 2012-03-01 – 2012-03-02 (×2): 12 mg via INTRAMUSCULAR
  Filled 2012-03-01 (×2): qty 2

## 2012-03-01 MED ORDER — SODIUM CHLORIDE 0.9 % IV SOLN
2.0000 g | Freq: Four times a day (QID) | INTRAVENOUS | Status: DC
  Administered 2012-03-01 – 2012-03-03 (×7): 2 g via INTRAVENOUS
  Filled 2012-03-01 (×8): qty 2000

## 2012-03-01 NOTE — H&P (Signed)
Abigail Morgan is a 21 y.o. female, G3 P0111, EGA 26W 3D with [facility] Jan 13, presenting for evaluation of leaking fluid.  At about 2300 last pm she started leaking fluid.  She called Dr. Senaida Ores who advised her to go to [facility]U for evaluation.  She did not go because the leaking stopped.  This morning she started having intermittent pain and some more leakage.  Evaluation in the office revealed + nitrazine and fern, cervix was visually not significantly dilated.  She has been getting weekly 17-P since she established care at 23 weeks for h/o PPROM and delivery at 35 weeks.  + GC treated 3 weeks ago.  See prenatal records for complete history.  Maternal Medical History:  Reason for admission: Reason for admission: rupture of membranes.  Contractions: Frequency: irregular.    Fetal activity: Perceived fetal activity is normal.      OB History    Grav Para Term Preterm Abortions TAB SAB Ect Mult Living   3 1 0 1 1 1 0 0 0 1     PPROM and SVD at 35 weeks  Past Medical History  Diagnosis Date  . No pertinent past medical history   . Asthma    Past Surgical History  Procedure Date  . No past surgeries    Family History: family history is not on file. Social History:  reports that she has been smoking Cigarettes.  She has a 3.5 pack-year smoking history. She has never used smokeless tobacco. She reports that she does not drink alcohol or use illicit drugs.   Prenatal Transfer Tool  Maternal Diabetes: No Genetic Screening: Declined Maternal Ultrasounds/Referrals: Normal Fetal Ultrasounds or other Referrals:  None Maternal Substance Abuse:  No Significant Maternal Medications:  None Significant Maternal Lab Results:  Lab values include: Other: Rubella equivocal Other Comments:  PPROM at 25 weeks  Review of Systems  Respiratory: Negative.   Cardiovascular: Negative.       Blood pressure 103/56, pulse 83, temperature 98.2 F (36.8 C), temperature source Oral, resp. rate 18, height 5'  (1.524 m), weight 52.39 kg (115 lb 8 oz), SpO2 100.00%, unknown if currently breastfeeding. Maternal Exam:  Uterine Assessment: Contraction strength is mild.  Contraction frequency is irregular.   Abdomen: Fetal presentation: vertex  Introitus: Normal vulva. Normal vagina.  Ferning test: positive.  Nitrazine test: positive. Amniotic fluid character: clear.  Pelvis: adequate for delivery.   Cervix: Cervix evaluated by sterile speculum exam.     Fetal Exam Fetal Monitor Review: Mode: ultrasound.   Baseline rate: 140.  Variability: moderate (6-25 bpm).   Pattern: no decelerations.    Fetal State Assessment: Category I - tracings are normal.     Physical Exam  Constitutional: She appears well-developed and well-nourished.  Cardiovascular: Normal rate, regular rhythm and normal heart sounds.   No murmur heard. Respiratory: Effort normal and breath sounds normal. No respiratory distress. She has no wheezes.  GI: Soft. There is no tenderness.       gravid    Prenatal labs: ABO, Rh: --/--/O POS (11/07 1630) Antibody: Negative (11/07 0000) Rubella: Equivocal RPR: NON REACTIVE (09/03 1050)  HBsAg: Negative (11/07 0000)  HIV: Non-reactive (11/07 0000)  GBS: Unknown (11/07 0000)   Assessment/Plan: IUP at 26W 3D with PPROM.  Will admit, betamethasone for pulmonary maturation, Amp and zithromax for latency.  She had ctx on admission so was started on Magnesium to try to give steroids 48 hrs to work and for CP prophylaxis if she progresses to  deliver.  If ctx are improved will d/c magnesium in am.   Abigail Morgan D 03/01/2012, 6:10 PM

## 2012-03-02 NOTE — Progress Notes (Signed)
UR Chart review completed.  

## 2012-03-02 NOTE — Progress Notes (Signed)
HD #2, 26W 4D, PPROM Feels ok, no ctx, +FM Afeb, VSS FHT- good for 26 weeks, rare ctx Will d/c magnesium.  Continue rest and obs, cont Amp and Zithromax, 2nd betamethasone today.  Vtx by u/s yesterday.

## 2012-03-03 ENCOUNTER — Encounter (HOSPITAL_COMMUNITY): Payer: Self-pay | Admitting: *Deleted

## 2012-03-03 MED ORDER — ONDANSETRON HCL 4 MG/2ML IJ SOLN: 4.0000 mg | INTRAMUSCULAR | Status: DC | PRN

## 2012-03-03 MED ORDER — TETANUS-DIPHTH-ACELL PERTUSSIS 5-2.5-18.5 LF-MCG/0.5 IM SUSP: 0.5000 mL | Freq: Once | INTRAMUSCULAR | Status: DC

## 2012-03-03 MED ORDER — PNEUMOCOCCAL VAC POLYVALENT 25 MCG/0.5ML IJ INJ
0.5000 mL | INJECTION | INTRAMUSCULAR | Status: DC
  Filled 2012-03-03: qty 0.5

## 2012-03-03 MED ORDER — BENZOCAINE-MENTHOL 20-0.5 % EX AERO: 1.0000 "application " | INHALATION_SPRAY | CUTANEOUS | Status: DC | PRN

## 2012-03-03 MED ORDER — MEASLES, MUMPS & RUBELLA VAC ~~LOC~~ INJ: 0.5000 mL | INJECTION | Freq: Once | SUBCUTANEOUS | Status: DC

## 2012-03-03 MED ORDER — LIDOCAINE HCL (PF) 1 % IJ SOLN
INTRAMUSCULAR | Status: AC
  Filled 2012-03-03: qty 30

## 2012-03-03 MED ORDER — OXYTOCIN 40 UNITS IN LACTATED RINGERS INFUSION - SIMPLE MED: 62.5000 mL/h | INTRAVENOUS | Status: AC | PRN

## 2012-03-03 MED ORDER — LANOLIN HYDROUS EX OINT: TOPICAL_OINTMENT | CUTANEOUS | Status: DC | PRN

## 2012-03-03 MED ORDER — ACETAMINOPHEN 325 MG PO TABS: 650.0000 mg | ORAL_TABLET | ORAL | Status: DC | PRN

## 2012-03-03 MED ORDER — OXYTOCIN BOLUS FROM INFUSION
500.0000 mL | Freq: Once | INTRAVENOUS | Status: AC
  Administered 2012-03-03: 500 mL via INTRAVENOUS
  Filled 2012-03-03: qty 500

## 2012-03-03 MED ORDER — BUTORPHANOL TARTRATE 1 MG/ML IJ SOLN
1.0000 mg | Freq: Once | INTRAMUSCULAR | Status: AC
Start: 2012-03-03 — End: 2012-03-03
  Administered 2012-03-03: 1 mg via INTRAVENOUS

## 2012-03-03 MED ORDER — FAMOTIDINE 20 MG PO TABS
20.0000 mg | ORAL_TABLET | Freq: Every day | ORAL | Status: DC
  Administered 2012-03-03 – 2012-03-04 (×2): 20 mg via ORAL
  Filled 2012-03-03 (×2): qty 1

## 2012-03-03 MED ORDER — DIBUCAINE 1 % RE OINT: 1.0000 "application " | TOPICAL_OINTMENT | RECTAL | Status: DC | PRN

## 2012-03-03 MED ORDER — OXYCODONE-ACETAMINOPHEN 5-325 MG PO TABS: 1.0000 | ORAL_TABLET | ORAL | Status: DC | PRN

## 2012-03-03 MED ORDER — ONDANSETRON HCL 4 MG PO TABS
4.0000 mg | ORAL_TABLET | ORAL | Status: DC | PRN
  Administered 2012-03-03: 4 mg via ORAL
  Filled 2012-03-03: qty 1

## 2012-03-03 MED ORDER — OXYCODONE-ACETAMINOPHEN 5-325 MG PO TABS
1.0000 | ORAL_TABLET | ORAL | Status: DC | PRN
  Administered 2012-03-03: 1 via ORAL
  Filled 2012-03-03: qty 1

## 2012-03-03 MED ORDER — SIMETHICONE 80 MG PO CHEW: 80.0000 mg | CHEWABLE_TABLET | ORAL | Status: DC | PRN

## 2012-03-03 MED ORDER — OXYTOCIN 40 UNITS IN LACTATED RINGERS INFUSION - SIMPLE MED: 62.5000 mL/h | Freq: Once | INTRAVENOUS | Status: DC

## 2012-03-03 MED ORDER — IBUPROFEN 600 MG PO TABS
600.0000 mg | ORAL_TABLET | Freq: Four times a day (QID) | ORAL | Status: DC
  Administered 2012-03-03 – 2012-03-04 (×5): 600 mg via ORAL
  Filled 2012-03-03 (×5): qty 1

## 2012-03-03 MED ORDER — OXYTOCIN 40 UNITS IN LACTATED RINGERS INFUSION - SIMPLE MED
INTRAVENOUS | Status: AC
  Filled 2012-03-03: qty 1000

## 2012-03-03 MED ORDER — LACTATED RINGERS IV SOLN: INTRAVENOUS | Status: AC

## 2012-03-03 MED ORDER — BUTORPHANOL TARTRATE 1 MG/ML IJ SOLN
INTRAMUSCULAR | Status: AC
  Administered 2012-03-03: 1 mg via INTRAVENOUS
  Filled 2012-03-03: qty 1

## 2012-03-03 MED ORDER — SENNOSIDES-DOCUSATE SODIUM 8.6-50 MG PO TABS
2.0000 | ORAL_TABLET | Freq: Every day | ORAL | Status: DC
  Administered 2012-03-03: 2 via ORAL

## 2012-03-03 MED ORDER — ZOLPIDEM TARTRATE 5 MG PO TABS
5.0000 mg | ORAL_TABLET | Freq: Every evening | ORAL | Status: DC | PRN
  Administered 2012-03-03: 5 mg via ORAL
  Filled 2012-03-03: qty 1

## 2012-03-03 MED ORDER — DIPHENHYDRAMINE HCL 25 MG PO CAPS: 25.0000 mg | ORAL_CAPSULE | Freq: Four times a day (QID) | ORAL | Status: DC | PRN

## 2012-03-03 MED ORDER — WITCH HAZEL-GLYCERIN EX PADS: 1.0000 "application " | MEDICATED_PAD | CUTANEOUS | Status: DC | PRN

## 2012-03-03 MED ORDER — PRENATAL MULTIVITAMIN CH
1.0000 | ORAL_TABLET | Freq: Every day | ORAL | Status: DC
  Administered 2012-03-03 – 2012-03-04 (×2): 1 via ORAL
  Filled 2012-03-03 (×2): qty 1

## 2012-03-03 MED ORDER — PRENATAL MULTIVITAMIN CH: 1.0000 | ORAL_TABLET | Freq: Every day | ORAL | Status: DC

## 2012-03-03 NOTE — Progress Notes (Signed)
Patient ID: Abigail Morgan, female   DOB: 09/13/1990, 21 y.o.   MRN: [id number] DOD VS normal Baby on the ventilator on room air.

## 2012-03-03 NOTE — Consult Note (Addendum)
[facility]  Delivery Note:  Vaginal Birth        03/03/2012  6:41 AM  I was called to Labor and Delivery at request of the patient's obstetrician (Dr. Henley) due to premature vaginal birth at 26 5/7 weeks.  PRENATAL HX:   History of previous premature ROM and delivery at 35 weeks.  With current pregnancy, mom given weekly 17-P since 23 weeks.  She was also treated for GC three weeks ago.  She presented to the hospital on 03/01/12 (2 days ago) with PROM since 02/29/12.  INTRAPARTUM HX:   Admitted on 03/01/12 and given betamethasone, ampicillin, and Zithromax.  This morning she had labor and cervical dilatation.  DELIVERY:   SVD in room 160.  We were called just prior to delivery.  Baby was 1 minute old on our arrival.  Female premature newborn was hypotonic and cyanotic, but breathing with HR over 100.  We dried her skin with towels and bulb suctioned the mouth and nose.  She was given blowby oxygen initially, followed by positive-pressure ventilations by bag/mask due to persistent central cyanosis.  At 7 minutes she was intubated by Eli Snyder, RT on first attempt.  2.5 Fr ETT inserted to 7 cm at the lip.  Correct position of tube in trachea confirmed by color change of CO2 indicator.  Equal breath sounds were appreciated with bagging.  HR was always over 100 bpm.  The ETT was secured, then at 13 minutes the baby was given 2.1 mL of Curosurf based on estimated weight of 800 grams (baby actually weighed 960 grams once admitted to the NICU).  HR remained normal, and baby looked well.  Thereafter we moved her to a transport isolette, showed her to mom, then took her along with her father to the NICU.  Apgars were 8 (assigned by OB and his nurse) and 8 (assigned by me).   ____________________ Electronically Signed By: Aavya Shafer S. Caren Garske, MD Neonatologist    

## 2012-03-03 NOTE — Progress Notes (Signed)
Patient ID: Abigail Morgan, female   DOB: 1990/07/24, 21 y.o.   MRN: [id number] I was called from antenatal and the nurse stated the pt was having pains. She called back and stated the cervix was 7 cm dilated. She was moved to L&D and by [name] the vertex is presenting and the cervix is 8 cm. I asked the pt to push and the vertex did not descend so will observe for now.

## 2012-03-03 NOTE — Progress Notes (Signed)
Patient ID: Abigail Morgan, female   DOB: 1990/10/26, 21 y.o.   MRN: [id number] Delivery note:  The pt became fully dilated and felt pressure so she pushed and delivered a living female infant over an intact perineum. Apgars were 8 and 8 at 1 and 5 minutes. Dr. Marthann Schiller arrived at 1 minute of age. The nose and mouth had been suctioned and the baby had cried. The placenta was removed intact and the uterus was normal. There was a formed clot at one margin of the placenta.Dr. Katrinka Blazing intubated the baby at 7 minutes of age because the baby was staying cyanotic. EBL 300 cc's.

## 2012-03-03 NOTE — Plan of Care (Signed)
Problem: Consults Goal: Birthing Suites Patient Information Press F2 to bring up selections list  Outcome: Completed/Met Date Met:  03/03/12  Pt < [redacted] weeks EGA Goal: Neonatologist Consult Outcome: Not Met (add Reason) Still waiting on NICU consult Goal: NICU Tour Outcome: Not Met (add Reason) Have not done tour yet

## 2012-03-04 LAB — CBC
HCT: 28 % — ABNORMAL LOW (ref 36.0–46.0)
MCH: 29.8 pg (ref 26.0–34.0)
MCV: 89.7 fL (ref 78.0–100.0)
Platelets: 367 10*3/uL (ref 150–400)
RDW: 13.7 % (ref 11.5–15.5)

## 2012-03-04 MED ORDER — OXYCODONE-ACETAMINOPHEN 5-325 MG PO TABS: 1.0000 | ORAL_TABLET | Freq: Four times a day (QID) | ORAL | Status: DC | PRN

## 2012-03-04 MED ORDER — IBUPROFEN 600 MG PO TABS: 600.0000 mg | ORAL_TABLET | Freq: Four times a day (QID) | ORAL | Status: DC | PRN

## 2012-03-04 NOTE — Progress Notes (Signed)
Patient ID: Abigail Morgan, female   DOB: September 16, 1990, 21 y.o.   MRN: [id number] #1 afebrile BP normal Baby is on high flow 21% O2. Was extubated yesterday' Pt wants d/c today

## 2012-03-04 NOTE — Clinical Social Work Note (Signed)
Clinical Social Work Department PSYCHOSOCIAL ASSESSMENT - MATERNAL/CHILD 03/04/2012  Patient:  Abigail Morgan,Abigail Morgan  Account Number:  [id number]  Admit Date:  03/01/2012  Childs Name:   MOB states she has not picked a name yet    Clinical Social Worker:  Arvis Zwahlen, LCSW   Date/Time:  03/04/2012 03:30 PM  Date Referred:  03/04/2012   Referral source  Physician     Referred reason  NICU   Other referral source:    I:  FAMILY / HOME ENVIRONMENT Child's legal guardian:  PARENT  Guardian - Name Guardian - Age Guardian - Address  Abigail Morgan 21 [address] [place], [place] [address]  Issac Hammie     Other household support members/support persons Name Relationship DOB  11 month old BROTHER    Other support:   MOB reports having good family support while infant is in NICU and when infant discharges home.    II  PSYCHOSOCIAL DATA Information Source:  Patient Interview  Financial and Community Resources Employment:   MOB: works at convenient store  FOB:  [employer] service   Financial resources:  Medicaid If Medicaid - County:  [place] Other  Food Stamps   School / Grade:   Maternity Care Coordinator / Child Services Coordination / Early Interventions:  Cultural issues impacting care:    III  STRENGTHS Strengths  Adequate Resources  Home prepared for Child (including basic supplies)  Supportive family/friends  Compliance with medical plan  Understanding of illness   Strength comment:    IV  RISK FACTORS AND CURRENT PROBLEMS Current Problem:  None   Risk Factor & Current Problem Patient Issue Family Issue Risk Factor / Current Problem Comment   N N     V  SOCIAL WORK ASSESSMENT CSW spoke with MOB in room.  CSW discussed NICU admission and understanding of illness and treatment.  MOB expressed understanding and did not have any concerns to report to CSW.  CSW discussed emotional stability.  MOB reported she is doing okay and will let RN or CSW  know if any concerns arise.  CSW discussed family support and any concerns with supplies for the infant.  MOB did not report any concerns with support.  She states good family involvement and FOB is involved.  She also stated no needs for supplies at this time.  MOB reported wanting Dr.Brett at [facility] for pediatrician when infant is able to discharge.  MOB did not report any concerns with medicaid and that she plans to look into WIC for infant.  CSW will continue to follow while infant is in NICU.      VI SOCIAL WORK PLAN Social Work Plan  Psychosocial Support/Ongoing Assessment of Needs   Type of pt/family education:   If child protective services report - county:   If child protective services report - date:   Information/referral to community resources comment:   Other social work plan:    

## 2012-03-04 NOTE — Progress Notes (Signed)
Pt discharged home with family. Pt ambulated and tolerated well. Discharged instructions given and pt verbalized understanding.

## 2012-03-04 NOTE — Discharge Summary (Signed)
Abigail Morgan, Abigail Morgan                 ACCOUNT NO.:  [id number]  MEDICAL RECORD NO.:  [id number]  LOCATION:  9316                          FACILITY:  [facility]  PHYSICIAN:  Malachi Pro. Ambrose Mantle, M.D. DATE OF BIRTH:  [birth year]  DATE OF ADMISSION:  03/01/2012 DATE OF DISCHARGE:  03/04/2012                              DISCHARGE SUMMARY   A 21 year old white female, para 0-1-1-1, gravida 3 with [facility] June 04, 2012 presented for evaluation of leaking fluid.  At about 2300 hours on February 29, 2012, she began leaking fluid.  She called Dr. Senaida Ores, who advised her to go to MAU for evaluation.  She did not go because the leaking stopped.  On the morning of admission, she started having intermittent pain and some more leakage.  Evaluation in the office revealed positive Nitrazine and fern.  Cervix was visually not significantly dilated.  She had been getting weekly 17 hydroxy progesterone shots that she established care at 23 weeks for history of preterm premature rupture of the membranes and delivery at 35 weeks. She did have gonorrhea 3 weeks ago that was treated.  She was O positive with a negative antibody, rubella was equivocal.  RPR nonreactive. Hepatitis B surface antigen negative, HIV negative, group B strep status unknown.  The patient was admitted, given betamethasone for pulmonary maturation, placed on ampicillin and Zithromax.  She was having contractions on admission, so she was started on magnesium sulfate.  We tried to give the steroids, time to work and for CP prophylaxis if she progressed to deliver.  On the morning after admission, the magnesium sulfate was discontinued.  Ampicillin and Zithromax were Contigen.  She got her second shot of betamethasone.  On March 03, 2012, I was called at approximately 5 a.m. from the antenatal unit stating the patient was having pains.  She then called back quickly and said the cervix was 7 cm dilated after I advised her to check the cervix.  She  was moved to L and D.  I did an ultrasound at the bedside confirming vertex presentation. Cervix was 8 cm.  The patient became fully dilated, felt pressure, so she pushed and delivered a living female infant over an intact perineum. Apgars were 8 and 8 at 1 and 5 minutes.  Dr. Ruben Gottron arrived at 1 minute of age.  The nose and mouth were then suctioned.  The baby had cried.  The placenta was removed intact.  The uterus was normal.  There was a formed clot at 1 margin of the placenta.  Dr. Katrinka Blazing intubated the baby at 7 minutes of age because the baby was staying cyanotic.  Blood loss is about 300 mL.  Postpartum the patient did well and wanted to be discharged on the first postpartum day.  The baby was extubated on March 03, 2012, is now on high-flow cannula at 21% O2.  LABORATORY DATA:  Showed initial hemoglobin of 9.8, hematocrit 28.7, white count 13,500, platelet count 322,000.  RPR was nonreactive. Followup hemoglobin 9.3, hematocrit 28.0, white count 13,100.  FINAL DIAGNOSES:  Intrauterine pregnancy 26 weeks and 6 days, delivered vertex, preterm, premature rupture of the membranes.  OPERATION:  Spontaneous  delivery vertex.  FINAL CONDITION:  Improved.  Instructions include our regular discharge instruction booklet as well as the after visit summary.  She is given a prescription for Motrin 600 mg 15 tablets with 1 refill, 1 every 6 hours as needed for pain, Percocet 5/325, 15 tablets 1 every 6 hours as needed for pain.  She is advised to take ferrous sulfate 325 mg twice daily.  Return to the office in 6 weeks for followup examination.     Malachi Pro. Ambrose Mantle, M.D.     TFH/MEDQ  D:  03/04/2012  T:  03/04/2012  Job:  [id number]

## 2012-03-27 ENCOUNTER — Encounter (HOSPITAL_COMMUNITY): Payer: Self-pay | Admitting: *Deleted

## 2012-12-30 ENCOUNTER — Encounter (HOSPITAL_COMMUNITY): Payer: Self-pay | Admitting: *Deleted

## 2012-12-30 ENCOUNTER — Inpatient Hospital Stay (HOSPITAL_COMMUNITY)
Admission: AD | Admit: 2012-12-30 | Discharge: 2012-12-30 | Disposition: A | Discharge status: Home / Self Care | Payer: Medicaid Other | Source: Ambulatory Visit | Attending: Obstetrics and Gynecology | Admitting: Obstetrics and Gynecology

## 2012-12-30 DIAGNOSIS — O210 Mild hyperemesis gravidarum: Secondary | ICD-10-CM | POA: Insufficient documentation

## 2012-12-30 DIAGNOSIS — O219 Vomiting of pregnancy, unspecified: Secondary | ICD-10-CM

## 2012-12-30 NOTE — MAU Note (Signed)
Pt need pregnancy verification letter

## 2012-12-30 NOTE — MAU Provider Note (Signed)
CC: Possible Pregnancy     Subjective HPI Abigail Morgan 22 y.o. [id number] [redacted]w[redacted]d by sure LMP presents for verification of pregnancy. She's felt tired and intermittently nauseated for about a month. Only vomits occasionally when she brushes her teeth. She's had bleeding cramps on and off for several weeks. No abdominal pain no vaginal bleeding. HPTs were "false negative." She would like to initiate [facility] at [facility].    Significant PMH: None.  Significant OB history: P1 SVD at 35 wks, P2 SVD at 26 wks Nonsmoker  Objective Last menstrual period 10/21/2012.  Physical Exam General: WN, WD female in no apparent distress Abdomen: soft, nontender, fundus 1/3 to u DT FHR 160  Lab Results Results for orders placed during the hospital encounter of 12/30/12 (from the past 24 hour(s))  POCT PREGNANCY, URINE     Status: Abnormal   Collection Time    12/30/12  3:19 PM      Result Value Range   Preg Test, Ur POSITIVE (*) NEGATIVE    Assessment 22 y.o. [id number] [redacted]w[redacted]d by LMP; viable pregnancy 1. Nausea and vomiting in pregnancy prior to [redacted] weeks gestation     Plan AVS on pregnancy precautions and N/V Pregnancy verification letter and eligibility information given List of prenatal care providers given   Medication List    STOP taking these medications       ibuprofen 600 MG tablet  Commonly known as:  ADVIL,MOTRIN     oxyCODONE-acetaminophen 5-325 MG per tablet  Commonly known as:  PERCOCET/ROXICET      TAKE these medications       prenatal multivitamin Tabs tablet  Take 1 tablet by mouth daily.     ranitidine 150 MG tablet  Commonly known as:  ZANTAC  Take 150 mg by mouth daily as needed. For heartburn         Follow-up Information   Follow up with Abigail Morgan. (Someone from the clinic will call you with an appointment)    Contact information:   [address] [place] [address] [phone]      Danae Orleans, CNM 12/30/2012 4:14  PM

## 2013-01-04 NOTE — MAU Provider Note (Signed)
Attestation of Attending Supervision of Advanced Practitioner: Evaluation and management procedures were performed by the PA/NP/CNM/OB Fellow under my supervision/collaboration. Chart reviewed and agree with management and plan.  Caitlen Worth V 01/04/2013 5:59 AM   

## 2013-01-24 ENCOUNTER — Encounter: Payer: Self-pay | Admitting: Obstetrics and Gynecology

## 2013-01-24 ENCOUNTER — Ambulatory Visit (INDEPENDENT_AMBULATORY_CARE_PROVIDER_SITE_OTHER): Payer: Medicaid Other | Admitting: Obstetrics and Gynecology

## 2013-01-24 VITALS — BP 101/63 | Wt 109.4 lb

## 2013-01-24 DIAGNOSIS — O09292 Supervision of pregnancy with other poor reproductive or obstetric history, second trimester: Secondary | ICD-10-CM

## 2013-01-24 DIAGNOSIS — O0992 Supervision of high risk pregnancy, unspecified, second trimester: Secondary | ICD-10-CM

## 2013-01-24 DIAGNOSIS — O09219 Supervision of pregnancy with history of pre-term labor, unspecified trimester: Secondary | ICD-10-CM | POA: Insufficient documentation

## 2013-01-24 DIAGNOSIS — O09299 Supervision of pregnancy with other poor reproductive or obstetric history, unspecified trimester: Secondary | ICD-10-CM

## 2013-01-24 DIAGNOSIS — O0990 Supervision of high risk pregnancy, unspecified, unspecified trimester: Secondary | ICD-10-CM | POA: Insufficient documentation

## 2013-01-24 DIAGNOSIS — O09212 Supervision of pregnancy with history of pre-term labor, second trimester: Secondary | ICD-10-CM

## 2013-01-24 LAB — POCT URINALYSIS DIP (DEVICE)
Bilirubin Urine: NEGATIVE
Glucose, UA: NEGATIVE mg/dL
Hgb urine dipstick: NEGATIVE
Ketones, ur: NEGATIVE mg/dL
Nitrite: NEGATIVE
Protein, ur: NEGATIVE mg/dL
Specific Gravity, Urine: 1.025 (ref 1.005–1.030)
Urobilinogen, UA: 0.2 mg/dL (ref 0.0–1.0)
pH: 7 (ref 5.0–8.0)

## 2013-01-24 MED ORDER — PRENATAL VITAMINS 0.8 MG PO TABS: 1.0000 | ORAL_TABLET | Freq: Every day | ORAL | Status: DC

## 2013-01-24 MED ORDER — HYDROXYPROGESTERONE CAPROATE 250 MG/ML IM OIL
250.0000 mg | TOPICAL_OIL | INTRAMUSCULAR | Status: DC
  Administered 2013-02-14 – 2013-05-28 (×14): 250 mg via INTRAMUSCULAR

## 2013-01-24 NOTE — Progress Notes (Signed)
   Subjective:    Abigail Morgan is a [id number] [redacted]w[redacted]d being seen today for her first obstetrical visit.  Her obstetrical history is significant for 2 previous preterm delivery at 35 weeks and 26 weeks secondary to PPROM. Patient does intend to breast feed. Pregnancy history fully reviewed.  Patient reports no complaints.  Filed Vitals:   01/24/13 0932  BP: 101/63  Weight: 109 lb 6.4 oz (49.624 kg)    HISTORY: OB History  Gravida Para Term Preterm AB SAB TAB Ectopic Multiple Living  4 2 0 2 1 0 1 0 0 2     # Outcome Date GA Lbr Len/2nd Weight Sex Delivery Anes PTL Lv  4 CUR           3 PRE 03/03/12 [redacted]w[redacted]d 01:09 / 00:10 2 lb 1.9 oz (0.96 kg) F SVD None  Y  2 PRE 03/31/11 [redacted]w[redacted]d 18:36 / 00:44 5 lb 5.2 oz (2.415 kg) M SVD EPI  Y     Comments: No dysmorphic features or abnormal findings  1 TAB              Past Medical History  Diagnosis Date  . No pertinent past medical history   . Asthma    Past Surgical History  Procedure Laterality Date  . No past surgeries     History reviewed. No pertinent family history.   Exam    Uterus:     Pelvic Exam:    Perineum: Normal Perineum   Vulva: normal   Vagina:  normal mucosa, normal discharge   pH:    Cervix: closed and long   Adnexa: no mass, fullness, tenderness   Bony Pelvis: android  System: Breast:  normal appearance, no masses or tenderness   Skin: normal coloration and turgor, no rashes    Neurologic: oriented, no focal deficits   Extremities: normal strength, tone, and muscle mass   HEENT extra ocular movement intact   Mouth/Teeth mucous membranes moist, pharynx normal without lesions   Neck supple and no masses   Cardiovascular: regular rate and rhythm   Respiratory:  chest clear, no wheezing, crepitations, rhonchi, normal symmetric air entry   Abdomen: soft, non-tender; bowel sounds normal; no masses,  no organomegaly   Urinary:       Assessment:    Pregnancy: [id number] Patient Active Problem List   Diagnosis Date  Noted  . Previous preterm delivery, antepartum 01/24/2013  . History of preterm premature rupture of membranes (PROM) in previous pregnancy, currently pregnant 01/24/2013  . Supervision of high-risk pregnancy 01/24/2013        Plan:     Initial labs drawn. Prenatal vitamins. Problem list reviewed and updated. Genetic Screening discussed Quad Screen: requested.  Ultrasound discussed; fetal survey: requested. Discussed use of weekly 17-P injections- patient agrees to that  Follow up in 3 weeks for 17-P and quad screen 50% of 30 min visit spent on counseling and coordination of care.     Tyera Hansley 01/24/2013

## 2013-01-24 NOTE — Progress Notes (Signed)
Pulse: 65

## 2013-01-24 NOTE — Progress Notes (Signed)
Nutrition note: 1st visit consult Pt has gained 9.4# @ [redacted]w[redacted]d, which is slightly > expected. Pt reports eating 3 meals & 2 snacks/d.  Pt has not started PNV yet. Pt reports N/V occ and no heartburn. Pt received verbal & written education on general nutrition during pregnancy. Discussed tips to decrease N/V. Discussed wt gain goals of 25-35# or 1#/wk. Pt agrees to start taking PNV. Pt does not have WIC but plans to apply. Pt plans to BF. F/u if referred Blondell Reveal, MS, RD, LDN

## 2013-01-24 NOTE — Patient Instructions (Signed)
Pregnancy - First Trimester During sexual intercourse, millions of sperm go into the vagina. Only 1 sperm will penetrate and fertilize the female egg while it is in the Fallopian tube. One week later, the fertilized egg implants into the wall of the uterus. An embryo begins to develop into a baby. At 6 to 8 weeks, the eyes and face are formed and the heartbeat can be seen on ultrasound. At the end of 12 weeks (first trimester), all the baby's organs are formed. Now that you are pregnant, you will want to do everything you can to have a healthy baby. Two of the most important things are to get good prenatal care and follow your caregiver's instructions. Prenatal care is all the medical care you receive before the baby's birth. It is given to prevent, find, and treat problems during the pregnancy and childbirth. PRENATAL EXAMS  During prenatal visits, your weight, blood pressure, and urine are checked. This is done to make sure you are healthy and progressing normally during the pregnancy.  A pregnant woman should gain 25 to 35 pounds during the pregnancy. However, if you are overweight or underweight, your caregiver will advise you regarding your weight.  Your caregiver will ask and answer questions for you.  Blood work, cervical cultures, other necessary tests, and a Pap test are done during your prenatal exams. These tests are done to check on your health and the probable health of your baby. Tests are strongly recommended and done for HIV with your permission. This is the virus that causes AIDS. These tests are done because medicines can be given to help prevent your baby from being born with this infection should you have been infected without knowing it. Blood work is also used to find out your blood type, previous infections, and follow your blood levels (hemoglobin).  Low hemoglobin (anemia) is common during pregnancy. Iron and vitamins are given to help prevent this. Later in the pregnancy,  blood tests for diabetes will be done along with any other tests if any problems develop.  You may need other tests to make sure you and the baby are doing well. CHANGES DURING THE FIRST TRIMESTER  Your body goes through many changes during pregnancy. They vary from person to person. Talk to your caregiver about changes you notice and are concerned about. Changes can include:  Your menstrual period stops.  The egg and sperm carry the genes that determine what you look like. Genes from you and your partner are forming a baby. The female genes determine whether the baby is a boy or a girl.  Your body increases in girth and you may feel bloated.  Feeling sick to your stomach (nauseous) and throwing up (vomiting). If the vomiting is uncontrollable, call your caregiver.  Your breasts will begin to enlarge and become tender.  Your nipples may stick out more and become darker.  The need to urinate more. Painful urination may mean you have a bladder infection.  Tiring easily.  Loss of appetite.  Cravings for certain kinds of food.  At first, you may gain or lose a couple of pounds.  You may have changes in your emotions from day to day (excited to be pregnant or concerned something may go wrong with the pregnancy and baby).  You may have more vivid and strange dreams. HOME CARE INSTRUCTIONS   It is very important to avoid all smoking, alcohol and non-prescribed drugs during your pregnancy. These affect the formation and growth of the baby.   Avoid chemicals while pregnant to ensure the delivery of a healthy infant.  Start your prenatal visits by the 12th week of pregnancy. They are usually scheduled monthly at first, then more often in the last 2 months before delivery. Keep your caregiver's appointments. Follow your caregiver's instructions regarding medicine use, blood and lab tests, exercise, and diet.  During pregnancy, you are providing food for you and your baby. Eat regular,  well-balanced meals. Choose foods such as meat, fish, milk and other low fat dairy products, vegetables, fruits, and whole-grain breads and cereals. Your caregiver will tell you of the ideal weight gain.  You can help morning sickness by keeping soda crackers at the bedside. Eat a couple before arising in the morning. You may want to use the crackers without salt on them.  Eating 4 to 5 small meals rather than 3 large meals a day also may help the nausea and vomiting.  Drinking liquids between meals instead of during meals also seems to help nausea and vomiting.  A physical sexual relationship may be continued throughout pregnancy if there are no other problems. Problems may be early (premature) leaking of amniotic fluid from the membranes, vaginal bleeding, or belly (abdominal) pain.  Exercise regularly if there are no restrictions. Check with your caregiver or physical therapist if you are unsure of the safety of some of your exercises. Greater weight gain will occur in the last 2 trimesters of pregnancy. Exercising will help:  Control your weight.  Keep you in shape.  Prepare you for labor and delivery.  Help you lose your pregnancy weight after you deliver your baby.  Wear a good support or jogging bra for breast tenderness during pregnancy. This may help if worn during sleep too.  Ask when prenatal classes are available. Begin classes when they are offered.  Do not use hot tubs, steam rooms, or saunas.  Wear your seat belt when driving. This protects you and your baby if you are in an accident.  Avoid raw meat, uncooked cheese, cat litter boxes, and soil used by cats throughout the pregnancy. These carry germs that can cause birth defects in the baby.  The first trimester is a good time to visit your dentist for your dental health. Getting your teeth cleaned is okay. Use a softer toothbrush and brush gently during pregnancy.  Ask for help if you have financial, counseling, or  nutritional needs during pregnancy. Your caregiver will be able to offer counseling for these needs as well as refer you for other special needs.  Do not take any medicines or herbs unless told by your caregiver.  Inform your caregiver if there is any mental or physical domestic violence.  Make a list of emergency phone numbers of family, friends, hospital, and police and fire departments.  Write down your questions. Take them to your prenatal visit.  Do not douche.  Do not cross your legs.  If you have to stand for long periods of time, rotate you feet or take small steps in a circle.  You may have more vaginal secretions that may require a sanitary pad. Do not use tampons or scented sanitary pads. MEDICINES AND DRUG USE IN PREGNANCY  Take prenatal vitamins as directed. The vitamin should contain 1 milligram of folic acid. Keep all vitamins out of reach of children. Only a couple vitamins or tablets containing iron may be fatal to a baby or young child when ingested.  Avoid use of all medicines, including herbs, over-the-counter medicines, not   prescribed or suggested by your caregiver. Only take over-the-counter or prescription medicines for pain, discomfort, or fever as directed by your caregiver. Do not use aspirin, ibuprofen, or naproxen unless directed by your caregiver.  Let your caregiver also know about herbs you may be using.  Alcohol is related to a number of birth defects. This includes fetal alcohol syndrome. All alcohol, in any form, should be avoided completely. Smoking will cause low birth rate and premature babies.  Street or illegal drugs are very harmful to the baby. They are absolutely forbidden. A baby born to an addicted mother will be addicted at birth. The baby will go through the same withdrawal an adult does.  Let your caregiver know about any medicines that you have to take and for what reason you take them. SEEK MEDICAL CARE IF:  You have any concerns or  worries during your pregnancy. It is better to call with your questions if you feel they cannot wait, rather than worry about them. SEEK IMMEDIATE MEDICAL CARE IF:   An unexplained oral temperature above 102 F (38.9 C) develops, or as your caregiver suggests.  You have leaking of fluid from the vagina (birth canal). If leaking membranes are suspected, take your temperature and inform your caregiver of this when you call.  There is vaginal spotting or bleeding. Notify your caregiver of the amount and how many pads are used.  You develop a bad smelling vaginal discharge with a change in the color.  You continue to feel sick to your stomach (nauseated) and have no relief from remedies suggested. You vomit blood or coffee ground-like materials.  You lose more than 2 pounds of weight in 1 week.  You gain more than 2 pounds of weight in 1 week and you notice swelling of your face, hands, feet, or legs.  You gain 5 pounds or more in 1 week (even if you do not have swelling of your hands, face, legs, or feet).  You get exposed to German measles and have never had them.  You are exposed to fifth disease or chickenpox.  You develop belly (abdominal) pain. Round ligament discomfort is a common non-cancerous (benign) cause of abdominal pain in pregnancy. Your caregiver still must evaluate this.  You develop headache, fever, diarrhea, pain with urination, or shortness of breath.  You fall or are in a car accident or have any kind of trauma.  There is mental or physical violence in your home. Document Released: 05/03/2001 Document Revised: 02/01/2012 Document Reviewed: 11/04/2008 [employer] Patient Information 2014 [employer].  Contraception Choices Contraception (birth control) is the use of any methods or devices to prevent pregnancy. Below are some methods to help avoid pregnancy. HORMONAL METHODS   Contraceptive implant. This is a thin, plastic tube containing progesterone hormone. It  does not contain estrogen hormone. Your caregiver inserts the tube in the inner part of the upper arm. The tube can remain in place for up to 3 years. After 3 years, the implant must be removed. The implant prevents the ovaries from releasing an egg (ovulation), thickens the cervical mucus which prevents sperm from entering the uterus, and thins the lining of the inside of the uterus.  Progesterone-only injections. These injections are given every 3 months by your caregiver to prevent pregnancy. This synthetic progesterone hormone stops the ovaries from releasing eggs. It also thickens cervical mucus and changes the uterine lining. This makes it harder for sperm to survive in the uterus.  Birth control pills. These pills   contain estrogen and progesterone hormone. They work by stopping the egg from forming in the ovary (ovulation). Birth control pills are prescribed by a caregiver.Birth control pills can also be used to treat heavy periods.  Minipill. This type of birth control pill contains only the progesterone hormone. They are taken every day of each month and must be prescribed by your caregiver.  Birth control patch. The patch contains hormones similar to those in birth control pills. It must be changed once a week and is prescribed by a caregiver.  Vaginal ring. The ring contains hormones similar to those in birth control pills. It is left in the vagina for 3 weeks, removed for 1 week, and then a new one is put back in place. The patient must be comfortable inserting and removing the ring from the vagina.A caregiver's prescription is necessary.  Emergency contraception. Emergency contraceptives prevent pregnancy after unprotected sexual intercourse. This pill can be taken right after sex or up to 5 days after unprotected sex. It is most effective the sooner you take the pills after having sexual intercourse. Emergency contraceptive pills are available without a prescription. Check with your  pharmacist. Do not use emergency contraception as your only form of birth control. BARRIER METHODS   Female condom. This is a thin sheath (latex or rubber) that is worn over the penis during sexual intercourse. It can be used with spermicide to increase effectiveness.  Female condom. This is a soft, loose-fitting sheath that is put into the vagina before sexual intercourse.  Diaphragm. This is a soft, latex, dome-shaped barrier that must be fitted by a caregiver. It is inserted into the vagina, along with a spermicidal jelly. It is inserted before intercourse. The diaphragm should be left in the vagina for 6 to 8 hours after intercourse.  Cervical cap. This is a round, soft, latex or plastic cup that fits over the cervix and must be fitted by a caregiver. The cap can be left in place for up to 48 hours after intercourse.  Sponge. This is a soft, circular piece of polyurethane foam. The sponge has spermicide in it. It is inserted into the vagina after wetting it and before sexual intercourse.  Spermicides. These are chemicals that kill or block sperm from entering the cervix and uterus. They come in the form of creams, jellies, suppositories, foam, or tablets. They do not require a prescription. They are inserted into the vagina with an applicator before having sexual intercourse. The process must be repeated every time you have sexual intercourse. INTRAUTERINE CONTRACEPTION  Intrauterine device (IUD). This is a T-shaped device that is put in a woman's uterus during a menstrual period to prevent pregnancy. There are 2 types:  Copper IUD. This type of IUD is wrapped in copper wire and is placed inside the uterus. Copper makes the uterus and fallopian tubes produce a fluid that kills sperm. It can stay in place for 10 years.  Hormone IUD. This type of IUD contains the hormone progestin (synthetic progesterone). The hormone thickens the cervical mucus and prevents sperm from entering the uterus, and it  also thins the uterine lining to prevent implantation of a fertilized egg. The hormone can weaken or kill the sperm that get into the uterus. It can stay in place for 5 years. PERMANENT METHODS OF CONTRACEPTION  Female tubal ligation. This is when the woman's fallopian tubes are surgically sealed, tied, or blocked to prevent the egg from traveling to the uterus.  Female sterilization. This is when   the female has the tubes that carry sperm tied off (vasectomy).This blocks sperm from entering the vagina during sexual intercourse. After the procedure, the man can still ejaculate fluid (semen). NATURAL PLANNING METHODS  Natural family planning. This is not having sexual intercourse or using a barrier method (condom, diaphragm, cervical cap) on days the woman could become pregnant.  Calendar method. This is keeping track of the length of each menstrual cycle and identifying when you are fertile.  Ovulation method. This is avoiding sexual intercourse during ovulation.  Symptothermal method. This is avoiding sexual intercourse during ovulation, using a thermometer and ovulation symptoms.  Post-ovulation method. This is timing sexual intercourse after you have ovulated. Regardless of which type or method of contraception you choose, it is important that you use condoms to protect against the transmission of sexually transmitted diseases (STDs). Talk with your caregiver about which form of contraception is most appropriate for you. Document Released: 05/09/2005 Document Revised: 08/01/2011 Document Reviewed: 09/15/2010 [employer] Patient Information 2014 [employer].  Breastfeeding A change in hormones during your pregnancy causes growth of your breast tissue and an increase in number and size of milk ducts. The hormone prolactin allows proteins, sugars, and fats from your blood supply to make breast milk in your milk-producing glands. The hormone progesterone prevents breast milk from being released  before the birth of your baby. After the birth of your baby, your progesterone level decreases allowing breast milk to be released. Thoughts of your baby, as well as his or her sucking or crying, can stimulate the release of milk from the milk-producing glands. Deciding to breastfeed (nurse) is one of the best choices you can make for you and your baby. The information that follows gives a brief review of the benefits, as well as other important skills to know about breastfeeding. BENEFITS OF BREASTFEEDING For your baby  The first milk (colostrum) helps your baby's digestive system function better.   There are antibodies in your milk that help your baby fight off infections.   Your baby has a lower incidence of asthma, allergies, and sudden infant death syndrome (SIDS).   The nutrients in breast milk are better for your baby than infant formulas.  Breast milk improves your baby's brain development.   Your baby will have less gas, colic, and constipation.  Your baby is less likely to develop other conditions, such as childhood obesity, asthma, or diabetes mellitus. For you  Breastfeeding helps develop a very special bond between you and your baby.   Breastfeeding is convenient, always available at the correct temperature, and costs nothing.   Breastfeeding helps to burn calories and helps you lose the weight gained during pregnancy.   Breastfeeding makes your uterus contract back down to normal size faster and slows bleeding following delivery.   Breastfeeding mothers have a lower risk of developing osteoporosis or breast or ovarian cancer later in life.  BREASTFEEDING FREQUENCY  A healthy, full-term baby may breastfeed as often as every hour or space his or her feedings to every 3 hours. Breastfeeding frequency will vary from baby to baby.   Newborns should be fed no less than every 2 3 hours during the day and every 4 5 hours during the night. You should breastfeed a  minimum of 8 feedings in a 24 hour period.  Awaken your baby to breastfeed if it has been 3 4 hours since the last feeding.  Breastfeed when you feel the need to reduce the fullness of your breasts or when   your newborn shows signs of hunger. Signs that your baby may be hungry include:  Increased alertness or activity.  Stretching.  Movement of the head from side to side.  Movement of the head and opening of the mouth when the corner of the mouth or cheek is stroked (rooting).  Increased sucking sounds, smacking lips, cooing, sighing, or squeaking.  Hand-to-mouth movements.  Increased sucking of fingers or hands.  Fussing.  Intermittent crying.  Signs of extreme hunger will require calming and consoling before you try to feed your baby. Signs of extreme hunger may include:  Restlessness.  A loud, strong cry.  Screaming.  Frequent feeding will help you make more milk and will help prevent problems, such as sore nipples and engorgement of the breasts.  BREASTFEEDING   Whether lying down or sitting, be sure that the baby's abdomen is facing your abdomen.   Support your breast with 4 fingers under your breast and your thumb above your nipple. Make sure your fingers are well away from your nipple and your baby's mouth.   Stroke your baby's lips gently with your finger or nipple.   When your baby's mouth is open wide enough, place all of your nipple and as much of the colored area around your nipple (areola) as possible into your baby's mouth.  More areola should be visible above his or her upper lip than below his or her lower lip.  Your baby's tongue should be between his or her lower gum and your breast.  Ensure that your baby's mouth is correctly positioned around the nipple (latched). Your baby's lips should create a seal on your breast.  Signs that your baby has effectively latched onto your nipple include:  Tugging or sucking without pain.  Swallowing heard  between sucks.  Absent click or smacking sound.  Muscle movement above and in front of his or her ears with sucking.  Your baby must suck about 2 3 minutes in order to get your milk. Allow your baby to feed on each breast as long as he or she wants. Nurse your baby until he or she unlatches or falls asleep at the first breast, then offer the second breast.  Signs that your baby is full and satisfied include:  A gradual decrease in the number of sucks or complete cessation of sucking.  Falling asleep.  Extension or relaxation of his or her body.  Retention of a small amount of milk in his or her mouth.  Letting go of your breast by himself or herself.  Signs of effective breastfeeding in you include:  Breasts that have increased firmness, weight, and size prior to feeding.  Breasts that are softer after nursing.  Increased milk volume, as well as a change in milk consistency and color by the 5th day of breastfeeding.  Breast fullness relieved by breastfeeding.  Nipples are not sore, cracked, or bleeding.  If needed, break the suction by putting your finger into the corner of your baby's mouth and sliding your finger between his or her gums. Then, remove your breast from his or her mouth.  It is common for babies to spit up a small amount after a feeding.  Babies often swallow air during feeding. This can make babies fussy. Burping your baby between breasts can help with this.  Vitamin D supplements are recommended for babies who get only breast milk.  Avoid using a pacifier during your baby's first 4 6 weeks.  Avoid supplemental feedings of water, formula, or   juice in place of breastfeeding. Breast milk is all the food your baby needs. It is not necessary for your baby to have water or formula. Your breasts will make more milk if supplemental feedings are avoided during the early weeks. HOW TO TELL WHETHER YOUR BABY IS GETTING ENOUGH BREAST MILK Wondering whether or not  your baby is getting enough milk is a common concern among mothers. You can be assured that your baby is getting enough milk if:   Your baby is actively sucking and you hear swallowing.   Your baby seems relaxed and satisfied after a feeding.   Your baby nurses at least 8 12 times in a 24 hour time period.  During the first 3 5 days of age:  Your baby is wetting at least 3 5 diapers in a 24 hour period. The urine should be clear and pale yellow.  Your baby is having at least 3 4 stools in a 24 hour period. The stool should be soft and yellow.  At 5 7 days of age, your baby is having at least 3 6 stools in a 24 hour period. The stool should be seedy and yellow by 5 days of age.  Your baby has a weight loss less than 7 10% during the first 3 days of age.  Your baby does not lose weight after 3 7 days of age.  Your baby gains 4 7 ounces each week after he or she is 4 days of age.  Your baby gains weight by 5 days of age and is back to birth weight within 2 weeks. ENGORGEMENT In the first week after your baby is born, you may experience extremely full breasts (engorgement). When engorged, your breasts may feel heavy, warm, or tender to the touch. Engorgement peaks within 24 48 hours after delivery of your baby.  Engorgement may be reduced by:  Continuing to breastfeed.  Increasing the frequency of breastfeeding.  Taking warm showers or applying warm, moist heat to your breasts just before each feeding. This increases circulation and helps the milk flow.   Gently massaging your breast before and during the feedings. With your fingertips, massage from your chest wall towards your nipple in a circular motion.   Ensuring that your baby empties at least one breast at every feeding. It also helps to start the next feeding on the opposite breast.   Expressing breast milk by hand or by using a breast pump to empty the breasts if your baby is sleepy, or not nursing well. You may also  want to express milk if you are returning to work oryou feel you are getting engorged.  Ensuring your baby is latched on and positioned properly while breastfeeding. If you follow these suggestions, your engorgement should improve in 24 48 hours. If you are still experiencing difficulty, call your lactation consultant or caregiver.  CARING FOR YOURSELF Take care of your breasts.  Bathe or shower daily.   Avoid using soap on your nipples.   Wear a supportive bra. Avoid wearing underwire style bras.  Air dry your nipples for a 3 4minutes after each feeding.   Use only cotton bra pads to absorb breast milk leakage. Leaking of breast milk between feedings is normal.   Use only pure lanolin on your nipples after nursing. You do not need to wash it off before feeding your baby again. Another option is to express a few drops of breast milk and gently massage that milk into your nipples.  Continue   breast self-awareness checks. Take care of yourself.  Eat healthy foods. Alternate 3 meals with 3 snacks.  Avoid foods that you notice affect your baby in a bad way.  Drink milk, fruit juice, and water to satisfy your thirst (about 8 glasses a day).   Rest often, relax, and take your prenatal vitamins to prevent fatigue, stress, and anemia.  Avoid chewing and smoking tobacco.  Avoid alcohol and drug use.  Take over-the-counter and prescribed medicine only as directed by your caregiver or pharmacist. You should always check with your caregiver or pharmacist before taking any new medicine, vitamin, or herbal supplement.  Know that pregnancy is possible while breastfeeding. If desired, talk to your caregiver about family planning and safe birth control methods that may be used while breastfeeding. SEEK MEDICAL CARE IF:   You feel like you want to stop breastfeeding or have become frustrated with breastfeeding.  You have painful breasts or nipples.  Your nipples are cracked or  bleeding.  Your breasts are red, tender, or warm.  You have a swollen area on either breast.  You have a fever or chills.  You have nausea or vomiting.  You have drainage from your nipples.  Your breasts do not become full before feedings by the 5th day after delivery.  You feel sad and depressed.  Your baby is too sleepy to eat well.  Your baby is having trouble sleeping.   Your baby is wetting less than 3 diapers in a 24 hour period.  Your baby has less than 3 stools in a 24 hour period.  Your baby's skin or the white part of his or her eyes becomes more yellow.   Your baby is not gaining weight by 5 days of age. MAKE SURE YOU:   Understand these instructions.  Will watch your condition.  Will get help right away if you are not doing well or get worse. Document Released: 05/09/2005 Document Revised: 02/01/2012 Document Reviewed: 12/14/2011 Exit[employer] Patient Information 2014 [employer].  

## 2013-01-25 LAB — OBSTETRIC PANEL
Antibody Screen: NEGATIVE
Basophils Relative: 0 % (ref 0–1)
HCT: 33.9 % — ABNORMAL LOW (ref 36.0–46.0)
Hemoglobin: 11.3 g/dL — ABNORMAL LOW (ref 12.0–15.0)
Lymphocytes Relative: 21 % (ref 12–46)
MCHC: 33.3 g/dL (ref 30.0–36.0)
Monocytes Absolute: 0.6 10*3/uL (ref 0.1–1.0)
Monocytes Relative: 6 % (ref 3–12)
Neutro Abs: 7.4 10*3/uL (ref 1.7–7.7)
Rh Type: POSITIVE
Rubella: 0.83 Index (ref <0.90)

## 2013-01-30 ENCOUNTER — Encounter: Payer: Self-pay | Admitting: *Deleted

## 2013-02-04 ENCOUNTER — Encounter: Payer: Self-pay | Admitting: *Deleted

## 2013-02-05 ENCOUNTER — Encounter: Payer: Self-pay | Admitting: *Deleted

## 2013-02-14 ENCOUNTER — Ambulatory Visit (INDEPENDENT_AMBULATORY_CARE_PROVIDER_SITE_OTHER): Payer: Medicaid Other | Admitting: Obstetrics & Gynecology

## 2013-02-14 VITALS — BP 101/62 | Temp 98.2°F | Wt 113.4 lb

## 2013-02-14 DIAGNOSIS — O09212 Supervision of pregnancy with history of pre-term labor, second trimester: Secondary | ICD-10-CM

## 2013-02-14 DIAGNOSIS — O09219 Supervision of pregnancy with history of pre-term labor, unspecified trimester: Secondary | ICD-10-CM

## 2013-02-14 LAB — POCT URINALYSIS DIP (DEVICE)
Hgb urine dipstick: NEGATIVE
Protein, ur: NEGATIVE mg/dL
Specific Gravity, Urine: 1.015 (ref 1.005–1.030)
Urobilinogen, UA: 0.2 mg/dL (ref 0.0–1.0)
pH: 7.5 (ref 5.0–8.0)

## 2013-02-14 NOTE — Progress Notes (Signed)
Will start 17 p, schedule detailed [name], size>dates which are unsure. Quad screen once dates established

## 2013-02-14 NOTE — Patient Instructions (Signed)

## 2013-02-14 NOTE — Progress Notes (Signed)
P-68 

## 2013-02-20 ENCOUNTER — Ambulatory Visit (HOSPITAL_COMMUNITY)
Admission: RE | Admit: 2013-02-20 | Discharge: 2013-02-20 | Disposition: A | Discharge status: Home / Self Care | Payer: Medicaid Other | Source: Ambulatory Visit | Attending: Obstetrics & Gynecology | Admitting: Obstetrics & Gynecology

## 2013-02-20 ENCOUNTER — Encounter (HOSPITAL_COMMUNITY): Payer: Self-pay

## 2013-02-20 ENCOUNTER — Other Ambulatory Visit: Payer: Self-pay | Admitting: Obstetrics & Gynecology

## 2013-02-20 DIAGNOSIS — Z1389 Encounter for screening for other disorder: Secondary | ICD-10-CM | POA: Insufficient documentation

## 2013-02-20 DIAGNOSIS — Z8751 Personal history of pre-term labor: Secondary | ICD-10-CM | POA: Insufficient documentation

## 2013-02-20 DIAGNOSIS — O99330 Smoking (tobacco) complicating pregnancy, unspecified trimester: Secondary | ICD-10-CM | POA: Insufficient documentation

## 2013-02-20 DIAGNOSIS — O09212 Supervision of pregnancy with history of pre-term labor, second trimester: Secondary | ICD-10-CM

## 2013-02-20 DIAGNOSIS — Z363 Encounter for antenatal screening for malformations: Secondary | ICD-10-CM | POA: Insufficient documentation

## 2013-02-20 DIAGNOSIS — O358XX0 Maternal care for other (suspected) fetal abnormality and damage, not applicable or unspecified: Secondary | ICD-10-CM | POA: Insufficient documentation

## 2013-02-20 NOTE — Progress Notes (Signed)
Abigail Morgan  was seen today for an ultrasound appointment.  See full report in [facility].  Impression: Single IUP at 21 4/7 weeks by ultrasound today Hx of preterm delivery at 35 and 26 weeks gestataion - on 17P injections Normal fetal anatomic survey.  Limited views of the fetal heart obtained due to fetal position. No markers associated with aneuploidy appreviated. Posterior fundal placenta without previa- multiple placental lakes noted (likely of no clinical consequence) Normal amniotic fluid volume  TVUS - Cervical length 3.3 cm without funneling or dynamic changes.  Some cervical debris noted at internal os.  Recommendations: Recommend follow up in 2 weeks for cervical length Follow up in 4 weeks to reevalaute anatomy and cervical length.  Alpha Gula, MD

## 2013-02-21 ENCOUNTER — Ambulatory Visit (INDEPENDENT_AMBULATORY_CARE_PROVIDER_SITE_OTHER): Payer: Medicaid Other | Admitting: Obstetrics and Gynecology

## 2013-02-21 VITALS — BP 109/68 | HR 72 | Wt 113.0 lb

## 2013-02-21 DIAGNOSIS — O09219 Supervision of pregnancy with history of pre-term labor, unspecified trimester: Secondary | ICD-10-CM

## 2013-02-21 DIAGNOSIS — O09212 Supervision of pregnancy with history of pre-term labor, second trimester: Secondary | ICD-10-CM

## 2013-02-28 ENCOUNTER — Encounter: Payer: Medicaid Other | Admitting: Obstetrics & Gynecology

## 2013-03-05 ENCOUNTER — Encounter (HOSPITAL_COMMUNITY): Payer: Self-pay | Admitting: Emergency Medicine

## 2013-03-05 ENCOUNTER — Emergency Department (HOSPITAL_COMMUNITY)
Admission: EM | Admit: 2013-03-05 | Discharge: 2013-03-05 | Disposition: A | Discharge status: Home / Self Care | Payer: Medicaid Other | Attending: Emergency Medicine | Admitting: Emergency Medicine

## 2013-03-05 DIAGNOSIS — J3489 Other specified disorders of nose and nasal sinuses: Secondary | ICD-10-CM | POA: Insufficient documentation

## 2013-03-05 DIAGNOSIS — Z87891 Personal history of nicotine dependence: Secondary | ICD-10-CM | POA: Insufficient documentation

## 2013-03-05 DIAGNOSIS — R110 Nausea: Secondary | ICD-10-CM

## 2013-03-05 DIAGNOSIS — Z331 Pregnant state, incidental: Secondary | ICD-10-CM | POA: Insufficient documentation

## 2013-03-05 DIAGNOSIS — R059 Cough, unspecified: Secondary | ICD-10-CM | POA: Insufficient documentation

## 2013-03-05 DIAGNOSIS — R0981 Nasal congestion: Secondary | ICD-10-CM

## 2013-03-05 DIAGNOSIS — R05 Cough: Secondary | ICD-10-CM | POA: Insufficient documentation

## 2013-03-05 DIAGNOSIS — J45909 Unspecified asthma, uncomplicated: Secondary | ICD-10-CM | POA: Insufficient documentation

## 2013-03-05 LAB — CBC WITH DIFFERENTIAL/PLATELET
Basophils Absolute: 0 10*3/uL (ref 0.0–0.1)
Eosinophils Absolute: 0.9 10*3/uL — ABNORMAL HIGH (ref 0.0–0.7)
Eosinophils Relative: 8 % — ABNORMAL HIGH (ref 0–5)
Lymphs Abs: 1.4 10*3/uL (ref 0.7–4.0)
MCH: 29.9 pg (ref 26.0–34.0)
MCV: 84.4 fL (ref 78.0–100.0)
Platelets: 306 10*3/uL (ref 150–400)
RDW: 14.1 % (ref 11.5–15.5)

## 2013-03-05 LAB — URINALYSIS, ROUTINE W REFLEX MICROSCOPIC
Hgb urine dipstick: NEGATIVE
Nitrite: NEGATIVE
Specific Gravity, Urine: 1.005 (ref 1.005–1.030)
Urobilinogen, UA: 0.2 mg/dL (ref 0.0–1.0)

## 2013-03-05 LAB — COMPREHENSIVE METABOLIC PANEL
ALT: 6 U/L (ref 0–35)
Calcium: 8.9 mg/dL (ref 8.4–10.5)
Creatinine, Ser: 0.46 mg/dL — ABNORMAL LOW (ref 0.50–1.10)
GFR calc Af Amer: >90 mL/min (ref >90)
Glucose, Bld: 88 mg/dL (ref 70–99)
Sodium: 135 mEq/L (ref 135–145)
Total Protein: 7.7 g/dL (ref 6.0–8.3)

## 2013-03-05 LAB — URINE MICROSCOPIC-ADD ON

## 2013-03-05 LAB — PREGNANCY, URINE: Preg Test, Ur: POSITIVE — AB

## 2013-03-05 MED ORDER — CETIRIZINE HCL 5 MG/5ML PO SYRP
5.0000 mg | ORAL_SOLUTION | Freq: Once | ORAL | Status: AC
  Administered 2013-03-05: 5 mg via ORAL
  Filled 2013-03-05: qty 5

## 2013-03-05 MED ORDER — CETIRIZINE HCL 10 MG PO TABS: 10.0000 mg | ORAL_TABLET | Freq: Every day | ORAL | Status: DC

## 2013-03-05 MED ORDER — ONDANSETRON 4 MG PO TBDP: 4.0000 mg | ORAL_TABLET | Freq: Three times a day (TID) | ORAL | Status: DC | PRN

## 2013-03-05 NOTE — ED Provider Notes (Signed)
CSN: [id number]     Arrival date & time 03/05/13  1107 History   First MD Initiated Contact with Patient 03/05/13 1253     Chief Complaint  Patient presents with  . Vomiting  . Rash  . Cough   (Consider location/radiation/quality/duration/timing/severity/associated sxs/prior Treatment) HPI Comments: Patient is a 22 year old G3P2 female who is [redacted] weeks pregnant and presents with nausea and vomiting for the past 3 days. Symptoms started gradually and remained constant. Symptoms typically happen after eating. Patient reports associated nasal and chest congestion with associated productive cough. The phlegm is clear. Patient has not tried anything for symptoms. Patient denies chest pain and SOB. No aggravating/alleviating factors. No associated symptoms.    Past Medical History  Diagnosis Date  . No pertinent past medical history   . Asthma    Past Surgical History  Procedure Laterality Date  . No past surgeries     History reviewed. No pertinent family history. History  Substance Use Topics  . Smoking status: Former Smoker -- 0.50 packs/day for 7 years    Types: Cigarettes    Quit date: 01/23/2013  . Smokeless tobacco: Never Used  . Alcohol Use: No   OB History   Grav Para Term Preterm Abortions TAB SAB Ect Mult Living   4 2 0 2 1 1 0 0 0 2      Review of Systems  HENT: Positive for congestion.   Respiratory: Positive for cough.   Gastrointestinal: Positive for nausea and vomiting.  All other systems reviewed and are negative.    Allergies  Review of patient's allergies indicates no known allergies.  Home Medications   Current Outpatient Rx  Name  Route  Sig  Dispense  Refill  . Prenatal Multivit-Min-Fe-FA (PRENATAL VITAMINS) 0.8 MG tablet   Oral   Take 1 tablet by mouth daily.   30 tablet   12   . ranitidine (ZANTAC) 150 MG tablet   Oral   Take 150 mg by mouth daily as needed. For heartburn          BP 101/53  Pulse 76  Temp(Src) 98.3 F (36.8 C)  (Oral)  Resp 16  Ht 5' (1.524 m)  Wt 113 lb (51.256 kg)  BMI 22.07 kg/m2  SpO2 97%  LMP 10/21/2012 Physical Exam  Nursing note and vitals reviewed. Constitutional: She is oriented to person, place, and time. She appears well-developed and well-nourished. No distress.  HENT:  Head: Normocephalic and atraumatic.  Eyes: Conjunctivae and EOM are normal.  Neck: Normal range of motion.  Cardiovascular: Normal rate and regular rhythm.  Exam reveals no gallop and no friction rub.   No murmur heard. Pulmonary/Chest: Effort normal and breath sounds normal. She has no wheezes. She has no rales. She exhibits no tenderness.  Abdominal: Soft. She exhibits no distension. There is no tenderness. There is no rebound and no guarding.  Gravid abdomen. No tenderness to palpation.   Musculoskeletal: Normal range of motion.  Neurological: She is alert and oriented to person, place, and time. Coordination normal.  Speech is goal-oriented. Moves limbs without ataxia.   Skin: Skin is warm and dry.  Psychiatric: She has a normal mood and affect. Her behavior is normal.    ED Course  Procedures (including critical care time) Labs Review Labs Reviewed  CBC WITH DIFFERENTIAL - Abnormal; Notable for the following:    WBC 11.3 (*)    Neutro Abs 8.2 (*)    Eosinophils Relative 8 (*)  Eosinophils Absolute 0.9 (*)    All other components within normal limits  COMPREHENSIVE METABOLIC PANEL - Abnormal; Notable for the following:    BUN 5 (*)    Creatinine, Ser 0.46 (*)    All other components within normal limits  URINALYSIS, ROUTINE W REFLEX MICROSCOPIC - Abnormal; Notable for the following:    Leukocytes, UA TRACE (*)    All other components within normal limits  PREGNANCY, URINE - Abnormal; Notable for the following:    Preg Test, Ur POSITIVE (*)    All other components within normal limits  URINE MICROSCOPIC-ADD ON - Abnormal; Notable for the following:    Bacteria, UA FEW (*)    All other  components within normal limits  URINE CULTURE  LIPASE, BLOOD   Imaging Review No results found.  EKG Interpretation   None       MDM   1. Congestion of nasal sinus   2. Nausea     3:16 PM Patient is [redacted] weeks pregnant and fetal monitoring shows good activity and pulse of fetus. Vitals stable and patient afebrile. Labs and urinalysis unremarkable. Patient will have topical triamcinolone cream for rash and cough suppressant and antihistamine for cough. No further evaluation needed at this time. Patient denies chest pain, SOB.   Emilia Beck, PA-C 03/05/13 1540

## 2013-03-05 NOTE — Progress Notes (Signed)
Called regarding pt who is 24 weeks with n/v and rash. OB RR RN in route

## 2013-03-05 NOTE — ED Notes (Signed)
RR OB at bedside. Hand-held doppler used to locate fetal heartbeat

## 2013-03-05 NOTE — ED Notes (Signed)
Pt c/o vomiting after eating; pt sts [redacted] weeks pregnant with high risk pregnancy due to hx of preterm deliveries x 2; pt sts rash to chest and cough with chest congestion; pt sts holding down liquids

## 2013-03-05 NOTE — ED Notes (Signed)
Pt comfortable with d/c and f/u instructions. Prescriptions x2. 

## 2013-03-05 NOTE — Progress Notes (Signed)
At bedside. Pt states having n/v for 24 hours. Pt denies contractions, leakage of fluid, vaginal bleeding. Pt states she has had positive fetal movement. Pt seen at high risk clinic at [facility] for 2 preterm deliveries due PPROM at 35wks and 26wks.

## 2013-03-05 NOTE — ED Notes (Signed)
Medication requested from pharmacy.

## 2013-03-05 NOTE — ED Notes (Signed)
PA at bedside.

## 2013-03-05 NOTE — ED Notes (Signed)
RR OB states pt is cleared by OB - states no contractions noted on minitor

## 2013-03-06 ENCOUNTER — Other Ambulatory Visit (HOSPITAL_COMMUNITY): Payer: Self-pay | Admitting: Maternal and Fetal Medicine

## 2013-03-06 ENCOUNTER — Ambulatory Visit (HOSPITAL_COMMUNITY): Admission: RE | Admit: 2013-03-06 | Payer: Medicaid Other | Source: Ambulatory Visit

## 2013-03-06 ENCOUNTER — Ambulatory Visit (HOSPITAL_COMMUNITY)
Admission: RE | Admit: 2013-03-06 | Discharge: 2013-03-06 | Disposition: A | Discharge status: Home / Self Care | Payer: Medicaid Other | Source: Ambulatory Visit | Attending: Maternal and Fetal Medicine | Admitting: Maternal and Fetal Medicine

## 2013-03-06 ENCOUNTER — Other Ambulatory Visit (HOSPITAL_COMMUNITY): Payer: Medicaid Other

## 2013-03-06 DIAGNOSIS — O09212 Supervision of pregnancy with history of pre-term labor, second trimester: Secondary | ICD-10-CM

## 2013-03-06 DIAGNOSIS — O99330 Smoking (tobacco) complicating pregnancy, unspecified trimester: Secondary | ICD-10-CM | POA: Insufficient documentation

## 2013-03-06 DIAGNOSIS — Z8751 Personal history of pre-term labor: Secondary | ICD-10-CM | POA: Insufficient documentation

## 2013-03-06 LAB — URINE CULTURE

## 2013-03-07 ENCOUNTER — Ambulatory Visit: Payer: Medicaid Other

## 2013-03-08 ENCOUNTER — Ambulatory Visit (INDEPENDENT_AMBULATORY_CARE_PROVIDER_SITE_OTHER): Payer: Medicaid Other

## 2013-03-08 VITALS — BP 108/65 | HR 67

## 2013-03-08 DIAGNOSIS — O09219 Supervision of pregnancy with history of pre-term labor, unspecified trimester: Secondary | ICD-10-CM

## 2013-03-08 DIAGNOSIS — O09299 Supervision of pregnancy with other poor reproductive or obstetric history, unspecified trimester: Secondary | ICD-10-CM

## 2013-03-08 NOTE — ED Provider Notes (Signed)
Medical screening examination/treatment/procedure(s) were performed by non-physician practitioner and as supervising physician I was immediately available for consultation/collaboration.   Hezakiah Champeau M Oveda Dadamo, DO 03/08/13 1318 

## 2013-03-14 ENCOUNTER — Ambulatory Visit (INDEPENDENT_AMBULATORY_CARE_PROVIDER_SITE_OTHER): Payer: Medicaid Other | Admitting: Obstetrics & Gynecology

## 2013-03-14 ENCOUNTER — Encounter: Payer: Self-pay | Admitting: Obstetrics & Gynecology

## 2013-03-14 VITALS — BP 106/65 | Temp 97.4°F | Wt 116.2 lb

## 2013-03-14 DIAGNOSIS — Z23 Encounter for immunization: Secondary | ICD-10-CM

## 2013-03-14 DIAGNOSIS — O4410 Placenta previa with hemorrhage, unspecified trimester: Secondary | ICD-10-CM

## 2013-03-14 DIAGNOSIS — O09219 Supervision of pregnancy with history of pre-term labor, unspecified trimester: Secondary | ICD-10-CM

## 2013-03-14 DIAGNOSIS — O4412 Placenta previa with hemorrhage, second trimester: Secondary | ICD-10-CM

## 2013-03-14 DIAGNOSIS — O0993 Supervision of high risk pregnancy, unspecified, third trimester: Secondary | ICD-10-CM

## 2013-03-14 DIAGNOSIS — O4440 Low lying placenta NOS or without hemorrhage, unspecified trimester: Secondary | ICD-10-CM | POA: Insufficient documentation

## 2013-03-14 LAB — POCT URINALYSIS DIP (DEVICE)
Bilirubin Urine: NEGATIVE
Glucose, UA: NEGATIVE mg/dL
Nitrite: NEGATIVE
Urobilinogen, UA: 0.2 mg/dL (ref 0.0–1.0)

## 2013-03-14 NOTE — Progress Notes (Signed)
Has rash whickh may have followed 17 p but wants to continue. Cough, was seen at [facility], told was allergies.

## 2013-03-14 NOTE — Progress Notes (Signed)
Pulse: 68

## 2013-03-19 ENCOUNTER — Other Ambulatory Visit: Payer: Self-pay | Admitting: *Deleted

## 2013-03-19 DIAGNOSIS — O09213 Supervision of pregnancy with history of pre-term labor, third trimester: Secondary | ICD-10-CM

## 2013-03-19 NOTE — Progress Notes (Signed)
Received call from Abigail Morgan in radiology requesting order to be placed for pt's [name] which is scheduled tomorrow. Per note from Dr. Claudean Severance on 10/1, pt was to have [name] in 4 weeks to follow up anatomy and cervical length. Order for [name] placed as requested.

## 2013-03-20 ENCOUNTER — Encounter: Payer: Self-pay | Admitting: *Deleted

## 2013-03-20 ENCOUNTER — Ambulatory Visit (HOSPITAL_COMMUNITY): Admission: RE | Admit: 2013-03-20 | Payer: Medicaid Other | Source: Ambulatory Visit

## 2013-03-20 ENCOUNTER — Ambulatory Visit (HOSPITAL_COMMUNITY)
Admission: RE | Admit: 2013-03-20 | Discharge: 2013-03-20 | Disposition: A | Discharge status: Home / Self Care | Payer: Medicaid Other | Source: Ambulatory Visit | Attending: Obstetrics & Gynecology | Admitting: Obstetrics & Gynecology

## 2013-03-20 DIAGNOSIS — O09213 Supervision of pregnancy with history of pre-term labor, third trimester: Secondary | ICD-10-CM

## 2013-03-20 DIAGNOSIS — Z8751 Personal history of pre-term labor: Secondary | ICD-10-CM | POA: Insufficient documentation

## 2013-03-20 DIAGNOSIS — Z3689 Encounter for other specified antenatal screening: Secondary | ICD-10-CM | POA: Insufficient documentation

## 2013-03-20 DIAGNOSIS — O99330 Smoking (tobacco) complicating pregnancy, unspecified trimester: Secondary | ICD-10-CM | POA: Insufficient documentation

## 2013-03-21 ENCOUNTER — Ambulatory Visit (INDEPENDENT_AMBULATORY_CARE_PROVIDER_SITE_OTHER): Payer: Medicaid Other

## 2013-03-21 VITALS — BP 99/60 | HR 68 | Temp 97.1°F | Wt 117.5 lb

## 2013-03-21 DIAGNOSIS — O09219 Supervision of pregnancy with history of pre-term labor, unspecified trimester: Secondary | ICD-10-CM

## 2013-03-21 DIAGNOSIS — O09292 Supervision of pregnancy with other poor reproductive or obstetric history, second trimester: Secondary | ICD-10-CM

## 2013-03-21 NOTE — Progress Notes (Signed)
Pt. Here fore 17P injection. States she's feeling the baby move often and has no questions or concerns at this time. Tolerated injection well.

## 2013-03-28 ENCOUNTER — Ambulatory Visit (INDEPENDENT_AMBULATORY_CARE_PROVIDER_SITE_OTHER): Payer: Medicaid Other | Admitting: *Deleted

## 2013-03-28 VITALS — BP 116/69 | Wt 117.7 lb

## 2013-03-28 DIAGNOSIS — O09293 Supervision of pregnancy with other poor reproductive or obstetric history, third trimester: Secondary | ICD-10-CM

## 2013-03-28 DIAGNOSIS — O09219 Supervision of pregnancy with history of pre-term labor, unspecified trimester: Secondary | ICD-10-CM

## 2013-03-28 NOTE — Addendum Note (Signed)
Addended by: Candelaria Stagers E on: 03/28/2013 02:37 PM   Modules accepted: Level of Service

## 2013-04-04 ENCOUNTER — Ambulatory Visit (INDEPENDENT_AMBULATORY_CARE_PROVIDER_SITE_OTHER): Payer: Medicaid Other | Admitting: Family

## 2013-04-04 VITALS — BP 111/68 | Temp 97.6°F | Wt 117.7 lb

## 2013-04-04 DIAGNOSIS — O09212 Supervision of pregnancy with history of pre-term labor, second trimester: Secondary | ICD-10-CM

## 2013-04-04 DIAGNOSIS — O09219 Supervision of pregnancy with history of pre-term labor, unspecified trimester: Secondary | ICD-10-CM

## 2013-04-04 DIAGNOSIS — Z23 Encounter for immunization: Secondary | ICD-10-CM

## 2013-04-04 DIAGNOSIS — O0993 Supervision of high risk pregnancy, unspecified, third trimester: Secondary | ICD-10-CM

## 2013-04-04 LAB — POCT URINALYSIS DIP (DEVICE)
Bilirubin Urine: NEGATIVE
Glucose, UA: NEGATIVE mg/dL
Ketones, ur: NEGATIVE mg/dL
Nitrite: NEGATIVE
Specific Gravity, Urine: 1.02 (ref 1.005–1.030)

## 2013-04-04 LAB — CBC
Hemoglobin: 11.8 g/dL — ABNORMAL LOW (ref 12.0–15.0)
Platelets: 347 10*3/uL (ref 150–400)
RBC: 4.08 MIL/uL (ref 3.87–5.11)
RDW: 14.1 % (ref 11.5–15.5)
WBC: 12.8 10*3/uL — ABNORMAL HIGH (ref 4.0–10.5)

## 2013-04-04 MED ORDER — TETANUS-DIPHTH-ACELL PERTUSSIS 5-2.5-18.5 LF-MCG/0.5 IM SUSP: 0.5000 mL | Freq: Once | INTRAMUSCULAR | Status: DC

## 2013-04-04 NOTE — Progress Notes (Signed)
No questions or concerns; dTap today; reviewed ultrasound results (25 wks nml). 17p.

## 2013-04-04 NOTE — Progress Notes (Signed)
P-65 

## 2013-04-05 ENCOUNTER — Encounter: Payer: Self-pay | Admitting: Family

## 2013-04-05 LAB — HIV ANTIBODY (ROUTINE TESTING W REFLEX): HIV: NONREACTIVE

## 2013-04-05 LAB — GLUCOSE TOLERANCE, 1 HOUR (50G) W/O FASTING: Glucose, 1 Hour GTT: 96 mg/dL (ref 70–140)

## 2013-04-05 LAB — RPR

## 2013-04-11 ENCOUNTER — Ambulatory Visit (INDEPENDENT_AMBULATORY_CARE_PROVIDER_SITE_OTHER): Payer: Medicaid Other | Admitting: *Deleted

## 2013-04-11 ENCOUNTER — Encounter: Payer: Self-pay | Admitting: *Deleted

## 2013-04-11 DIAGNOSIS — O09219 Supervision of pregnancy with history of pre-term labor, unspecified trimester: Secondary | ICD-10-CM

## 2013-04-11 DIAGNOSIS — O09293 Supervision of pregnancy with other poor reproductive or obstetric history, third trimester: Secondary | ICD-10-CM

## 2013-04-11 NOTE — Addendum Note (Signed)
Addended by: Candelaria Stagers E on: 04/11/2013 11:42 AM   Modules accepted: Level of Service

## 2013-04-17 ENCOUNTER — Ambulatory Visit (INDEPENDENT_AMBULATORY_CARE_PROVIDER_SITE_OTHER): Payer: Medicaid Other | Admitting: *Deleted

## 2013-04-17 VITALS — BP 105/66 | HR 84 | Temp 98.2°F | Wt 119.6 lb

## 2013-04-17 DIAGNOSIS — O09219 Supervision of pregnancy with history of pre-term labor, unspecified trimester: Secondary | ICD-10-CM

## 2013-04-17 DIAGNOSIS — O09213 Supervision of pregnancy with history of pre-term labor, third trimester: Secondary | ICD-10-CM

## 2013-04-25 ENCOUNTER — Ambulatory Visit (INDEPENDENT_AMBULATORY_CARE_PROVIDER_SITE_OTHER): Payer: Medicaid Other | Admitting: Obstetrics & Gynecology

## 2013-04-25 VITALS — BP 109/71 | Temp 98.1°F | Wt 122.4 lb

## 2013-04-25 DIAGNOSIS — O0993 Supervision of high risk pregnancy, unspecified, third trimester: Secondary | ICD-10-CM

## 2013-04-25 DIAGNOSIS — O09219 Supervision of pregnancy with history of pre-term labor, unspecified trimester: Secondary | ICD-10-CM

## 2013-04-25 DIAGNOSIS — O09293 Supervision of pregnancy with other poor reproductive or obstetric history, third trimester: Secondary | ICD-10-CM

## 2013-04-25 DIAGNOSIS — R82998 Other abnormal findings in urine: Secondary | ICD-10-CM

## 2013-04-25 LAB — POCT URINALYSIS DIP (DEVICE)
Bilirubin Urine: NEGATIVE
Hgb urine dipstick: NEGATIVE
Ketones, ur: NEGATIVE mg/dL
Protein, ur: NEGATIVE mg/dL
Specific Gravity, Urine: 1.02 (ref 1.005–1.030)
pH: 7 (ref 5.0–8.0)

## 2013-04-25 NOTE — Patient Instructions (Signed)
Postpartum Tubal Ligation A postpartum tubal ligation (PPTL) is a procedure that blocks the fallopian tubes right after childbirth or 1 2 days after childbirth. PPTL is done before the uterus returns to its normal location. The procedure is also called a minilaparotomy. By blocking the fallopian tubes, the eggs that are released from the ovaries cannot enter the uterus and sperm cannot reach the egg. A PPTL is done so you will not be able to get pregnant or have a baby.  Although this procedure may be reversed, it should be considered permanent and irreversible. If you want to have future pregnancies, you should not have this procedure. LET YOUR CAREGIVER KNOW ABOUT:  Allergies to food or medicine.  Medicines taken, including vitamins, herbs, eyedrops, over-the-counter medicines, and creams.  Use of steroids (by mouth or creams).  Previous problems with numbing medicines.  History of bleeding problems or blood clots.  Any recent colds or infections.  Previous surgery.  Other health problems, including diabetes and kidney problems. RISKS AND COMPLICATIONS  Infection.  Bleeding.  Injury to other organs.  Anesthetic side effects.  Failure of the procedure.  Ectopic pregnancy.  Future regret about having the procedure done. BEFORE THE PROCEDURE   You may need to sign certain permission forms with your insurance up to 30 days before your due date.  After delivering your baby, you cannot eat or drink anything if the procedure is performed the same day. If you are having the procedure a day after delivering, you may be able to eat and drink until midnight. Your caregiver will give you specific directions depending on your situation. PROCEDURE   If done 1-2 days after delivery:  You will be given a medicine to make you sleep (general anesthetic) during the procedure.  A tube will be put down your throat to help your breath while under general anesthesia.  A small cut  (incision) is made just beneath the belly button.  The fallopian tubes are brought up through the incision.  The fallopian tubes are then sealed, tied, or cut.  If done after a caesarean delivery:  Tubal ligation is done through the incision already made (after the baby is delivered).  Once the tubes are blocked, the incision is closed with stitches (sutures). A bandage will be placed over the incisions. AFTER THE PROCEDURE  You may have some pain or cramps in the abdominal area for the next 3 7 days.  You will be given pain medicine to ease any discomfort.  You may also feel sick to your stomach if you were given a general anesthetic.  You may have some mild discomfort in the throat. This is from the tube that may have been placed in your throat while you were sleeping.  You may feel tired and should rest the remainder of the day. Document Released: 05/09/2005 Document Revised: 11/08/2011 Document Reviewed: 08/20/2011 Exit[employer] Patient Information 2014 [employer].  

## 2013-04-25 NOTE — Progress Notes (Signed)
P=69  Needs 17P

## 2013-04-25 NOTE — Progress Notes (Signed)
No contractions, 17 P today. Sign BTL papers today

## 2013-04-27 LAB — CULTURE, OB URINE: Organism ID, Bacteria: NO GROWTH

## 2013-05-02 ENCOUNTER — Ambulatory Visit (INDEPENDENT_AMBULATORY_CARE_PROVIDER_SITE_OTHER): Payer: Medicaid Other | Admitting: General Practice

## 2013-05-02 VITALS — BP 103/66 | HR 72 | Temp 97.7°F | Ht 60.0 in | Wt 122.3 lb

## 2013-05-02 DIAGNOSIS — O09213 Supervision of pregnancy with history of pre-term labor, third trimester: Secondary | ICD-10-CM

## 2013-05-02 DIAGNOSIS — O09219 Supervision of pregnancy with history of pre-term labor, unspecified trimester: Secondary | ICD-10-CM

## 2013-05-07 ENCOUNTER — Ambulatory Visit (INDEPENDENT_AMBULATORY_CARE_PROVIDER_SITE_OTHER): Payer: Medicaid Other | Admitting: Obstetrics and Gynecology

## 2013-05-07 ENCOUNTER — Encounter: Payer: Self-pay | Admitting: Obstetrics and Gynecology

## 2013-05-07 VITALS — BP 99/63 | Temp 97.6°F | Wt 122.9 lb

## 2013-05-07 DIAGNOSIS — O0993 Supervision of high risk pregnancy, unspecified, third trimester: Secondary | ICD-10-CM

## 2013-05-07 DIAGNOSIS — O09299 Supervision of pregnancy with other poor reproductive or obstetric history, unspecified trimester: Secondary | ICD-10-CM

## 2013-05-07 DIAGNOSIS — O09293 Supervision of pregnancy with other poor reproductive or obstetric history, third trimester: Secondary | ICD-10-CM

## 2013-05-07 DIAGNOSIS — O09219 Supervision of pregnancy with history of pre-term labor, unspecified trimester: Secondary | ICD-10-CM

## 2013-05-07 DIAGNOSIS — O09213 Supervision of pregnancy with history of pre-term labor, third trimester: Secondary | ICD-10-CM

## 2013-05-07 LAB — POCT URINALYSIS DIP (DEVICE)
Bilirubin Urine: NEGATIVE
Hgb urine dipstick: NEGATIVE
Ketones, ur: NEGATIVE mg/dL
Nitrite: NEGATIVE
pH: 7 (ref 5.0–8.0)

## 2013-05-07 NOTE — Progress Notes (Signed)
Patient doing well without complaints. FM/PTL precautions reviewed. Continue weekly 17-P. BLT papers to be signed today as they were not signed next visit

## 2013-05-07 NOTE — Progress Notes (Signed)
P=79 

## 2013-05-14 ENCOUNTER — Ambulatory Visit: Payer: Medicaid Other

## 2013-05-21 ENCOUNTER — Ambulatory Visit (INDEPENDENT_AMBULATORY_CARE_PROVIDER_SITE_OTHER): Payer: Medicaid Other | Admitting: Obstetrics & Gynecology

## 2013-05-21 VITALS — BP 114/66 | Temp 97.3°F | Wt 126.4 lb

## 2013-05-21 DIAGNOSIS — O09219 Supervision of pregnancy with history of pre-term labor, unspecified trimester: Secondary | ICD-10-CM

## 2013-05-21 DIAGNOSIS — O0993 Supervision of high risk pregnancy, unspecified, third trimester: Secondary | ICD-10-CM

## 2013-05-21 DIAGNOSIS — O09293 Supervision of pregnancy with other poor reproductive or obstetric history, third trimester: Secondary | ICD-10-CM

## 2013-05-21 LAB — POCT URINALYSIS DIP (DEVICE)
Bilirubin Urine: NEGATIVE
Glucose, UA: NEGATIVE mg/dL
Ketones, ur: NEGATIVE mg/dL
Nitrite: NEGATIVE
Protein, ur: NEGATIVE mg/dL
Specific Gravity, Urine: 1.02 (ref 1.005–1.030)
pH: 7 (ref 5.0–8.0)

## 2013-05-21 NOTE — Progress Notes (Signed)
Pt with no complaints 

## 2013-05-21 NOTE — Patient Instructions (Signed)
Preterm Labor Information Preterm labor is when labor starts at less than 37 weeks of pregnancy. The normal length of a pregnancy is 39 to 41 weeks. CAUSES Often, there is no identifiable underlying cause as to why a woman goes into preterm labor. One of the most common known causes of preterm labor is infection. Infections of the uterus, cervix, vagina, amniotic sac, bladder, kidney, or even the lungs (pneumonia) can cause labor to start. Other suspected causes of preterm labor include:   Urogenital infections, such as yeast infections and bacterial vaginosis.   Uterine abnormalities (uterine shape, uterine septum, fibroids, or bleeding from the placenta).   A cervix that has been operated on (it may fail to stay closed).   Malformations in the fetus.   Multiple gestations (twins, triplets, and so on).   Breakage of the amniotic sac.  RISK FACTORS  Having a previous history of preterm labor.   Having premature rupture of membranes (PROM).   Having a placenta that covers the opening of the cervix (placenta previa).   Having a placenta that separates from the uterus (placental abruption).   Having a cervix that is too weak to hold the fetus in the uterus (incompetent cervix).   Having too much fluid in the amniotic sac (polyhydramnios).   Taking illegal drugs or smoking while pregnant.   Not gaining enough weight while pregnant.   Being younger than 18 and older than 24 years old.   Having a low socioeconomic status.   Being African American. SYMPTOMS Signs and symptoms of preterm labor include:   Menstrual-like cramps, abdominal pain, or back pain.  Uterine contractions that are regular, as frequent as six in an hour, regardless of their intensity (may be mild or painful).  Contractions that start on the top of the uterus and spread down to the lower abdomen and back.   A sense of increased pelvic pressure.   A watery or bloody mucus discharge that  comes from the vagina.  TREATMENT Depending on the length of the pregnancy and other circumstances, your health care provider may suggest bed rest. If necessary, there are medicines that can be given to stop contractions and to mature the fetal lungs. If labor happens before 34 weeks of pregnancy, a prolonged hospital stay may be recommended. Treatment depends on the condition of both you and the fetus.  WHAT SHOULD YOU DO IF YOU THINK YOU ARE IN PRETERM LABOR? Call your health care provider right away. You will need to go to the hospital to get checked immediately. HOW CAN YOU PREVENT PRETERM LABOR IN FUTURE PREGNANCIES? You should:   Stop smoking if you smoke.  Maintain healthy weight gain and avoid chemicals and drugs that are not necessary.  Be watchful for any type of infection.  Inform your health care provider if you have a known history of preterm labor. Document Released: 07/30/2003 Document Revised: 01/09/2013 Document Reviewed: 06/11/2012 Exit[employer] Patient Information 2014 [employer].    

## 2013-05-21 NOTE — Progress Notes (Signed)
P-75 

## 2013-05-23 NOTE — L&D Delivery Note (Signed)
Delivery Note At 4:46 PM a viable female was delivered via Vaginal, Spontaneous Delivery (Presentation:LOA).  APGAR: 8, 9; weight TBD.   Placenta status: Intact, Spontaneous.  Cord: 3 vessels with the following complications: None.    Anesthesia: Epidural  Episiotomy: None Lacerations: Labial Suture Repair: 3.0 monocryl Est. Blood Loss (mL): 250  Mom to postpartum.  Baby to Couplet care / Skin to Skin.  Pt pushed with good maternal effort with epidural to deliver a liveborn female with spontaneous cry.  Shoulder cord. Baby immediately placed on skin to skin. Delayed cord clamping performed and cord cut by grandmother of the baby.  Placenta delivered spontaneously intact with 3V cord using gentle traction and pitocin. Small left labial laceration repaired for hemostasis.  No complications. EBL of 250cc. Mom to postpartum and baby to skin to skin. BTL tomorrow at noon.  NPO after 5am.    Abigail Morgan L 06/18/2013, 5:21 PM

## 2013-05-28 ENCOUNTER — Ambulatory Visit (INDEPENDENT_AMBULATORY_CARE_PROVIDER_SITE_OTHER): Payer: Medicaid Other | Admitting: *Deleted

## 2013-05-28 VITALS — BP 102/69 | HR 75 | Temp 98.5°F | Wt 125.3 lb

## 2013-05-28 DIAGNOSIS — O09219 Supervision of pregnancy with history of pre-term labor, unspecified trimester: Secondary | ICD-10-CM

## 2013-05-28 DIAGNOSIS — O09213 Supervision of pregnancy with history of pre-term labor, third trimester: Secondary | ICD-10-CM

## 2013-05-29 ENCOUNTER — Encounter: Payer: Self-pay | Admitting: *Deleted

## 2013-06-04 ENCOUNTER — Ambulatory Visit (INDEPENDENT_AMBULATORY_CARE_PROVIDER_SITE_OTHER): Payer: Medicaid Other | Admitting: Family

## 2013-06-04 VITALS — BP 118/75 | Temp 98.6°F | Wt 126.8 lb

## 2013-06-04 DIAGNOSIS — O09219 Supervision of pregnancy with history of pre-term labor, unspecified trimester: Secondary | ICD-10-CM

## 2013-06-04 DIAGNOSIS — O09299 Supervision of pregnancy with other poor reproductive or obstetric history, unspecified trimester: Secondary | ICD-10-CM

## 2013-06-04 DIAGNOSIS — O0990 Supervision of high risk pregnancy, unspecified, unspecified trimester: Secondary | ICD-10-CM

## 2013-06-04 LAB — OB RESULTS CONSOLE GC/CHLAMYDIA
CHLAMYDIA, DNA PROBE: NEGATIVE
Gonorrhea: NEGATIVE

## 2013-06-04 LAB — POCT URINALYSIS DIP (DEVICE)
BILIRUBIN URINE: NEGATIVE
Glucose, UA: NEGATIVE mg/dL
Ketones, ur: NEGATIVE mg/dL
NITRITE: NEGATIVE
PH: 7 (ref 5.0–8.0)
PROTEIN: NEGATIVE mg/dL
Specific Gravity, Urine: 1.02 (ref 1.005–1.030)
Urobilinogen, UA: 0.2 mg/dL (ref 0.0–1.0)

## 2013-06-04 LAB — OB RESULTS CONSOLE GBS: GBS: NEGATIVE

## 2013-06-04 NOTE — Progress Notes (Signed)
I examined pt and agree with documentation above and nurse midwife student plan of care.  Obtained 17P today.   [facility],Nema Oatley

## 2013-06-04 NOTE — Progress Notes (Signed)
p-93 

## 2013-06-04 NOTE — Progress Notes (Signed)
Patient reports no concerns at this time.  GC/CT and GBS test today and discussed protocol if positive. Reviewed labor precautions.

## 2013-06-05 LAB — GC/CHLAMYDIA PROBE AMP
CT Probe RNA: NEGATIVE
GC Probe RNA: NEGATIVE

## 2013-06-07 LAB — CULTURE, BETA STREP (GROUP B ONLY)

## 2013-06-11 ENCOUNTER — Encounter: Payer: Self-pay | Admitting: Family

## 2013-06-12 ENCOUNTER — Encounter: Payer: Self-pay | Admitting: Obstetrics and Gynecology

## 2013-06-12 ENCOUNTER — Ambulatory Visit (INDEPENDENT_AMBULATORY_CARE_PROVIDER_SITE_OTHER): Payer: Medicaid Other | Admitting: Obstetrics and Gynecology

## 2013-06-12 VITALS — BP 102/69 | Temp 96.6°F | Wt 125.5 lb

## 2013-06-12 DIAGNOSIS — O09219 Supervision of pregnancy with history of pre-term labor, unspecified trimester: Secondary | ICD-10-CM

## 2013-06-12 LAB — POCT URINALYSIS DIP (DEVICE)
Bilirubin Urine: NEGATIVE
Glucose, UA: NEGATIVE mg/dL
HGB URINE DIPSTICK: NEGATIVE
Ketones, ur: NEGATIVE mg/dL
NITRITE: NEGATIVE
PROTEIN: NEGATIVE mg/dL
Specific Gravity, Urine: 1.02 (ref 1.005–1.030)
UROBILINOGEN UA: 0.2 mg/dL (ref 0.0–1.0)
pH: 6 (ref 5.0–8.0)

## 2013-06-12 NOTE — Progress Notes (Signed)
Dong well. Discussed plans for PPS and circ.

## 2013-06-12 NOTE — Progress Notes (Signed)
p-80 

## 2013-06-12 NOTE — Patient Instructions (Signed)
Third Trimester of Pregnancy  The third trimester is from week 29 through week 42, months 7 through 9. The third trimester is a time when the fetus is growing rapidly. At the end of the ninth month, the fetus is about 20 inches in length and weighs 6 10 pounds.   BODY CHANGES  Your body goes through many changes during pregnancy. The changes vary from woman to woman.    Your weight will continue to increase. You can expect to gain 25 35 pounds (11 16 kg) by the end of the pregnancy.   You may begin to get stretch marks on your hips, abdomen, and breasts.   You may urinate more often because the fetus is moving lower into your pelvis and pressing on your bladder.   You may develop or continue to have heartburn as a result of your pregnancy.   You may develop constipation because certain hormones are causing the muscles that push waste through your intestines to slow down.   You may develop hemorrhoids or swollen, bulging veins (varicose veins).   You may have pelvic pain because of the weight gain and pregnancy hormones relaxing your joints between the bones in your pelvis. Back aches may result from over exertion of the muscles supporting your posture.   Your breasts will continue to grow and be tender. A yellow discharge may leak from your breasts called colostrum.   Your belly button may stick out.   You may feel short of breath because of your expanding uterus.   You may notice the fetus "dropping," or moving lower in your abdomen.   You may have a bloody mucus discharge. This usually occurs a few days to a week before labor begins.   Your cervix becomes thin and soft (effaced) near your due date.  WHAT TO EXPECT AT YOUR PRENATAL EXAMS   You will have prenatal exams every 2 weeks until week 36. Then, you will have weekly prenatal exams. During a routine prenatal visit:   You will be weighed to make sure you and the fetus are growing normally.   Your blood pressure is taken.   Your abdomen will be  measured to track your baby's growth.   The fetal heartbeat will be listened to.   Any test results from the previous visit will be discussed.   You may have a cervical check near your due date to see if you have effaced.  At around 36 weeks, your caregiver will check your cervix. At the same time, your caregiver will also perform a test on the secretions of the vaginal tissue. This test is to determine if a type of bacteria, Group B streptococcus, is present. Your caregiver will explain this further.  Your caregiver may ask you:   What your birth plan is.   How you are feeling.   If you are feeling the baby move.   If you have had any abnormal symptoms, such as leaking fluid, bleeding, severe headaches, or abdominal cramping.   If you have any questions.  Other tests or screenings that may be performed during your third trimester include:   Blood tests that check for low iron levels (anemia).   Fetal testing to check the health, activity level, and growth of the fetus. Testing is done if you have certain medical conditions or if there are problems during the pregnancy.  FALSE LABOR  You may feel small, irregular contractions that eventually go away. These are called Braxton Hicks contractions, or   false labor. Contractions may last for hours, days, or even weeks before true labor sets in. If contractions come at regular intervals, intensify, or become painful, it is best to be seen by your caregiver.   SIGNS OF LABOR    Menstrual-like cramps.   Contractions that are 5 minutes apart or less.   Contractions that start on the top of the uterus and spread down to the lower abdomen and back.   A sense of increased pelvic pressure or back pain.   A watery or bloody mucus discharge that comes from the vagina.  If you have any of these signs before the 37th week of pregnancy, call your caregiver right away. You need to go to the hospital to get checked immediately.  HOME CARE INSTRUCTIONS    Avoid all  smoking, herbs, alcohol, and unprescribed drugs. These chemicals affect the formation and growth of the baby.   Follow your caregiver's instructions regarding medicine use. There are medicines that are either safe or unsafe to take during pregnancy.   Exercise only as directed by your caregiver. Experiencing uterine cramps is a good sign to stop exercising.   Continue to eat regular, healthy meals.   Wear a good support bra for breast tenderness.   Do not use hot tubs, steam rooms, or saunas.   Wear your seat belt at all times when driving.   Avoid raw meat, uncooked cheese, cat litter boxes, and soil used by cats. These carry germs that can cause birth defects in the baby.   Take your prenatal vitamins.   Try taking a stool softener (if your caregiver approves) if you develop constipation. Eat more high-fiber foods, such as fresh vegetables or fruit and whole grains. Drink plenty of fluids to keep your urine clear or pale yellow.   Take warm sitz baths to soothe any pain or discomfort caused by hemorrhoids. Use hemorrhoid cream if your caregiver approves.   If you develop varicose veins, wear support hose. Elevate your feet for 15 minutes, 3 4 times a day. Limit salt in your diet.   Avoid heavy lifting, wear low heal shoes, and practice good posture.   Rest a lot with your legs elevated if you have leg cramps or low back pain.   Visit your dentist if you have not gone during your pregnancy. Use a soft toothbrush to brush your teeth and be gentle when you floss.   A sexual relationship may be continued unless your caregiver directs you otherwise.   Do not travel far distances unless it is absolutely necessary and only with the approval of your caregiver.   Take prenatal classes to understand, practice, and ask questions about the labor and delivery.   Make a trial run to the hospital.   Pack your hospital bag.   Prepare the baby's nursery.   Continue to go to all your prenatal visits as directed  by your caregiver.  SEEK MEDICAL CARE IF:   You are unsure if you are in labor or if your water has broken.   You have dizziness.   You have mild pelvic cramps, pelvic pressure, or nagging pain in your abdominal area.   You have persistent nausea, vomiting, or diarrhea.   You have a bad smelling vaginal discharge.   You have pain with urination.  SEEK IMMEDIATE MEDICAL CARE IF:    You have a fever.   You are leaking fluid from your vagina.   You have spotting or bleeding from your vagina.     You have severe abdominal cramping or pain.   You have rapid weight loss or gain.   You have shortness of breath with chest pain.   You notice sudden or extreme swelling of your face, hands, ankles, feet, or legs.   You have not felt your baby move in over an hour.   You have severe headaches that do not go away with medicine.   You have vision changes.  Document Released: 05/03/2001 Document Revised: 01/09/2013 Document Reviewed: 07/10/2012  [employer] Patient Information 2014 [employer].

## 2013-06-14 ENCOUNTER — Inpatient Hospital Stay (HOSPITAL_COMMUNITY)
Admission: AD | Admit: 2013-06-14 | Discharge: 2013-06-14 | Disposition: A | Discharge status: Home / Self Care | Payer: Medicaid Other | Source: Ambulatory Visit | Attending: Obstetrics & Gynecology | Admitting: Obstetrics & Gynecology

## 2013-06-14 ENCOUNTER — Encounter (HOSPITAL_COMMUNITY): Payer: Self-pay

## 2013-06-14 DIAGNOSIS — K2990 Gastroduodenitis, unspecified, without bleeding: Secondary | ICD-10-CM

## 2013-06-14 DIAGNOSIS — O212 Late vomiting of pregnancy: Secondary | ICD-10-CM | POA: Insufficient documentation

## 2013-06-14 DIAGNOSIS — K2970 Gastritis, unspecified, without bleeding: Secondary | ICD-10-CM

## 2013-06-14 DIAGNOSIS — R197 Diarrhea, unspecified: Secondary | ICD-10-CM | POA: Insufficient documentation

## 2013-06-14 DIAGNOSIS — Z87891 Personal history of nicotine dependence: Secondary | ICD-10-CM | POA: Insufficient documentation

## 2013-06-14 LAB — URINE MICROSCOPIC-ADD ON

## 2013-06-14 LAB — URINALYSIS, ROUTINE W REFLEX MICROSCOPIC
Bilirubin Urine: NEGATIVE
Glucose, UA: NEGATIVE mg/dL
Hgb urine dipstick: NEGATIVE
Ketones, ur: 15 mg/dL — AB
Nitrite: NEGATIVE
Protein, ur: 30 mg/dL — AB
SPECIFIC GRAVITY, URINE: 1.025 (ref 1.005–1.030)
UROBILINOGEN UA: 0.2 mg/dL (ref 0.0–1.0)
pH: 6 (ref 5.0–8.0)

## 2013-06-14 MED ORDER — PROMETHAZINE HCL 12.5 MG PO TABS: 12.5000 mg | ORAL_TABLET | Freq: Four times a day (QID) | ORAL | Status: DC | PRN

## 2013-06-14 MED ORDER — ONDANSETRON 4 MG PO TBDP: 4.0000 mg | ORAL_TABLET | Freq: Three times a day (TID) | ORAL | Status: DC | PRN

## 2013-06-14 NOTE — MAU Note (Signed)
Pt states since last pm has been having back pain, cramping, and n/v. Denies diarrhea. No sore throat. Denies bleeding or lof.

## 2013-06-14 NOTE — Progress Notes (Signed)
Dr. Ike Benedom notified pt here for n/v, afebrile, will come see pt shortly. RN to give pt gingerale.

## 2013-06-14 NOTE — Discharge Instructions (Signed)

## 2013-06-14 NOTE — MAU Provider Note (Signed)
History     CSN: [id number]  Arrival date and time: 06/14/13 1106   None     Chief Complaint  Patient presents with  . N/V/D    HPI  Abigail Morgan is a [age] yof [id number] at 2858w5d who presents with nausea and vomiting since last night.  States she between 5-10 episodes of emesis.  Pt also endorses a temperature of 100.0, myalgias, chill and pelvic pressure.  Pt denies sore throat, cough, blood in her vomit and diarrhea.  She currently does not feel any contractions and denies ROM.  OB History   Grav Para Term Preterm Abortions TAB SAB Ect Mult Living   4 2 0 2 1 1 0 0 0 2       Past Medical History  Diagnosis Date  . No pertinent past medical history   . Asthma   . Preterm labor     Past Surgical History  Procedure Laterality Date  . No past surgeries      History reviewed. No pertinent family history.  History  Substance Use Topics  . Smoking status: Former Smoker -- 0.50 packs/day for 7 years    Types: Cigarettes    Quit date: 01/23/2013  . Smokeless tobacco: Never Used  . Alcohol Use: No    Allergies: No Known Allergies  Facility-administered medications prior to admission  Medication Dose Route Frequency Provider Last Rate Last Dose  . hydroxyprogesterone caproate (DELALUTIN) 250 mg/mL injection 250 mg  250 mg Intramuscular Weekly Peggy Constant, MD   250 mg at 05/28/13 1151  . TDaP (BOOSTRIX) injection 0.5 mL  0.5 mL Intramuscular Once Walidah N Muhammad, CNM       Prescriptions prior to admission  Medication Sig Dispense Refill  . Prenatal Vit-Fe Fumarate-FA (PRENATAL MULTIVITAMIN) TABS tablet Take 1 tablet by mouth daily at 12 noon.      . ranitidine (ZANTAC) 150 MG tablet Take 150 mg by mouth daily as needed for heartburn.       . [DISCONTINUED] ondansetron (ZOFRAN ODT) 4 MG disintegrating tablet Take 1 tablet (4 mg total) by mouth every 8 (eight) hours as needed for nausea.  10 tablet  0    Review of Systems  Constitutional: Positive for chills and  malaise/fatigue.  Eyes: Negative for blurred vision and double vision.  Respiratory: Negative for cough.   Cardiovascular: Negative for chest pain.  Gastrointestinal: Positive for nausea and vomiting. Negative for abdominal pain, diarrhea and constipation.  Genitourinary: Negative for dysuria, urgency and frequency.       No vaginal bleeding and/or discharge  Musculoskeletal: Positive for myalgias.  Neurological: Negative for dizziness and headaches.   Physical Exam   Blood pressure 114/74, pulse 92, temperature 98.3 F (36.8 C), temperature source Oral, resp. rate 16, height 5' (1.524 m), weight 56.303 kg (124 lb 2 oz), last menstrual period 10/21/2012, not currently breastfeeding.  Physical Exam  Constitutional: She is oriented to person, place, and time. She appears well-developed and well-nourished. No distress.  HENT:  Head: Normocephalic.  Eyes: Pupils are equal, round, and reactive to light.  Cardiovascular: Normal rate and normal heart sounds.   Respiratory: Effort normal.  GI: Bowel sounds are normal. There is no tenderness. There is no rebound and no guarding.  Gravid abdomen   Genitourinary:  Cervical check:   Musculoskeletal: She exhibits no edema and no tenderness.  Neurological: She is alert and oriented to person, place, and time.  Skin: Skin is warm and dry.  Psychiatric: She has a  normal mood and affect. Her behavior is normal.   Dilation: 1 Effacement (%): 50 Cervical Position: Middle Station: -3 Presentation: Vertex Exam by:: Abigail Morgan, RNC/T. Tolilolo, Abigail Morgan  140s mod var, mult accles >15x15 no decels Irregular Q3-8min nonpainful  MAU Course  Procedures  MDM  Influenza swap   Assessment and Plan  Abigail Morgan is a 23 y.o. [phone] at [redacted]w[redacted]d presents with acute nausea and vomiting from last night, improved this afternoon. Tolerating PO. Likely a viral gastritis vs acute food poisoning. Will give phenergan for PRN use and pt to f/u as previously  scheduled. Flu swab collected, low suspicion and will discharge while pending. If + will call pta dn have her pick up Rx. Pt states understanding and agrees with plan. Labor precautions reviewed, reassuring fetal status - not active labor.  Jolyn Lent RYAN 06/14/2013, 2:30 PM

## 2013-06-16 LAB — RESPIRATORY VIRUS PANEL
Adenovirus: NOT DETECTED
INFLUENZA A: NOT DETECTED
INFLUENZA B 1: NOT DETECTED
Influenza A H1: NOT DETECTED
Influenza A H3: NOT DETECTED
METAPNEUMOVIRUS: NOT DETECTED
PARAINFLUENZA 2 A: NOT DETECTED
Parainfluenza 1: NOT DETECTED
Parainfluenza 3: NOT DETECTED
Respiratory Syncytial Virus A: NOT DETECTED
Respiratory Syncytial Virus B: NOT DETECTED
Rhinovirus: NOT DETECTED

## 2013-06-18 ENCOUNTER — Encounter (HOSPITAL_COMMUNITY): Payer: Self-pay | Admitting: *Deleted

## 2013-06-18 ENCOUNTER — Inpatient Hospital Stay (HOSPITAL_COMMUNITY)
Admission: AD | Admit: 2013-06-18 | Discharge: 2013-06-20 | DRG: 767 | Disposition: A | Discharge status: Home / Self Care | Payer: Medicaid Other | Source: Ambulatory Visit | Attending: Obstetrics & Gynecology | Admitting: Obstetrics & Gynecology

## 2013-06-18 ENCOUNTER — Encounter: Payer: Medicaid Other | Admitting: Obstetrics and Gynecology

## 2013-06-18 ENCOUNTER — Encounter (HOSPITAL_COMMUNITY): Payer: Medicaid Other | Admitting: Anesthesiology

## 2013-06-18 ENCOUNTER — Inpatient Hospital Stay (HOSPITAL_COMMUNITY): Payer: Medicaid Other | Admitting: Anesthesiology

## 2013-06-18 DIAGNOSIS — O0940 Supervision of pregnancy with grand multiparity, unspecified trimester: Secondary | ICD-10-CM

## 2013-06-18 DIAGNOSIS — O4290 Premature rupture of membranes, unspecified as to length of time between rupture and onset of labor, unspecified weeks of gestation: Secondary | ICD-10-CM

## 2013-06-18 DIAGNOSIS — IMO0001 Reserved for inherently not codable concepts without codable children: Secondary | ICD-10-CM

## 2013-06-18 DIAGNOSIS — Z3490 Encounter for supervision of normal pregnancy, unspecified, unspecified trimester: Secondary | ICD-10-CM

## 2013-06-18 DIAGNOSIS — Z302 Encounter for sterilization: Secondary | ICD-10-CM

## 2013-06-18 LAB — CBC
HEMATOCRIT: 35.6 % — AB (ref 36.0–46.0)
HEMOGLOBIN: 12.6 g/dL (ref 12.0–15.0)
MCH: 30.3 pg (ref 26.0–34.0)
MCHC: 35.4 g/dL (ref 30.0–36.0)
MCV: 85.6 fL (ref 78.0–100.0)
Platelets: 404 10*3/uL — ABNORMAL HIGH (ref 150–400)
RBC: 4.16 MIL/uL (ref 3.87–5.11)
RDW: 13.3 % (ref 11.5–15.5)
WBC: 16.7 10*3/uL — ABNORMAL HIGH (ref 4.0–10.5)

## 2013-06-18 LAB — RPR: RPR Ser Ql: NONREACTIVE

## 2013-06-18 MED ORDER — EPHEDRINE 5 MG/ML INJ
10.0000 mg | INTRAVENOUS | Status: DC | PRN
  Filled 2013-06-18: qty 2

## 2013-06-18 MED ORDER — FENTANYL 2.5 MCG/ML BUPIVACAINE 1/10 % EPIDURAL INFUSION (WH - ANES)
INTRAMUSCULAR | Status: AC
  Filled 2013-06-18: qty 125

## 2013-06-18 MED ORDER — LIDOCAINE HCL (PF) 1 % IJ SOLN
30.0000 mL | INTRAMUSCULAR | Status: DC | PRN
  Filled 2013-06-18 (×2): qty 30

## 2013-06-18 MED ORDER — BENZOCAINE-MENTHOL 20-0.5 % EX AERO
1.0000 "application " | INHALATION_SPRAY | CUTANEOUS | Status: DC | PRN
  Administered 2013-06-18: 1 via TOPICAL
  Filled 2013-06-18: qty 56

## 2013-06-18 MED ORDER — ONDANSETRON HCL 4 MG/2ML IJ SOLN: 4.0000 mg | INTRAMUSCULAR | Status: DC | PRN

## 2013-06-18 MED ORDER — PHENYLEPHRINE 40 MCG/ML (10ML) SYRINGE FOR IV PUSH (FOR BLOOD PRESSURE SUPPORT)
80.0000 ug | PREFILLED_SYRINGE | INTRAVENOUS | Status: DC | PRN
  Filled 2013-06-18: qty 2

## 2013-06-18 MED ORDER — METOCLOPRAMIDE HCL 10 MG PO TABS
10.0000 mg | ORAL_TABLET | Freq: Once | ORAL | Status: AC
  Administered 2013-06-19: 10 mg via ORAL
  Filled 2013-06-18: qty 1

## 2013-06-18 MED ORDER — IBUPROFEN 600 MG PO TABS
600.0000 mg | ORAL_TABLET | Freq: Four times a day (QID) | ORAL | Status: DC
  Administered 2013-06-18 – 2013-06-20 (×6): 600 mg via ORAL
  Filled 2013-06-18 (×6): qty 1

## 2013-06-18 MED ORDER — MEASLES, MUMPS & RUBELLA VAC ~~LOC~~ INJ: 0.5000 mL | INJECTION | Freq: Once | SUBCUTANEOUS | Status: DC

## 2013-06-18 MED ORDER — LACTATED RINGERS IV SOLN
INTRAVENOUS | Status: DC
  Administered 2013-06-18 (×2): via INTRAVENOUS

## 2013-06-18 MED ORDER — LACTATED RINGERS IV SOLN
500.0000 mL | Freq: Once | INTRAVENOUS | Status: AC
  Administered 2013-06-18: 500 mL via INTRAVENOUS

## 2013-06-18 MED ORDER — LACTATED RINGERS IV SOLN: 500.0000 mL | INTRAVENOUS | Status: DC | PRN

## 2013-06-18 MED ORDER — PHENYLEPHRINE 40 MCG/ML (10ML) SYRINGE FOR IV PUSH (FOR BLOOD PRESSURE SUPPORT)
PREFILLED_SYRINGE | INTRAVENOUS | Status: AC
  Filled 2013-06-18: qty 10

## 2013-06-18 MED ORDER — FENTANYL 2.5 MCG/ML BUPIVACAINE 1/10 % EPIDURAL INFUSION (WH - ANES)
14.0000 mL/h | INTRAMUSCULAR | Status: DC | PRN
  Administered 2013-06-18: 14 mL/h via EPIDURAL

## 2013-06-18 MED ORDER — FAMOTIDINE 20 MG PO TABS
40.0000 mg | ORAL_TABLET | Freq: Once | ORAL | Status: AC
  Administered 2013-06-19: 40 mg via ORAL
  Filled 2013-06-18: qty 2

## 2013-06-18 MED ORDER — ACETAMINOPHEN 325 MG PO TABS
650.0000 mg | ORAL_TABLET | ORAL | Status: DC | PRN
Start: 2013-06-18 — End: 2013-06-18

## 2013-06-18 MED ORDER — OXYTOCIN 40 UNITS IN LACTATED RINGERS INFUSION - SIMPLE MED
62.5000 mL/h | INTRAVENOUS | Status: DC
  Administered 2013-06-18: 62.5 mL/h via INTRAVENOUS
  Filled 2013-06-18: qty 1000

## 2013-06-18 MED ORDER — LANOLIN HYDROUS EX OINT: TOPICAL_OINTMENT | CUTANEOUS | Status: DC | PRN

## 2013-06-18 MED ORDER — CITRIC ACID-SODIUM CITRATE 334-500 MG/5ML PO SOLN: 30.0000 mL | ORAL | Status: DC | PRN

## 2013-06-18 MED ORDER — TETANUS-DIPHTH-ACELL PERTUSSIS 5-2.5-18.5 LF-MCG/0.5 IM SUSP: 0.5000 mL | Freq: Once | INTRAMUSCULAR | Status: DC

## 2013-06-18 MED ORDER — LACTATED RINGERS IV SOLN
INTRAVENOUS | Status: DC
  Administered 2013-06-19 (×3): via INTRAVENOUS

## 2013-06-18 MED ORDER — DIPHENHYDRAMINE HCL 50 MG/ML IJ SOLN: 12.5000 mg | INTRAMUSCULAR | Status: DC | PRN

## 2013-06-18 MED ORDER — LACTATED RINGERS IV SOLN: 500.0000 mL | Freq: Once | INTRAVENOUS | Status: DC

## 2013-06-18 MED ORDER — SIMETHICONE 80 MG PO CHEW: 80.0000 mg | CHEWABLE_TABLET | ORAL | Status: DC | PRN

## 2013-06-18 MED ORDER — ONDANSETRON HCL 4 MG PO TABS: 4.0000 mg | ORAL_TABLET | ORAL | Status: DC | PRN

## 2013-06-18 MED ORDER — EPHEDRINE 5 MG/ML INJ
INTRAVENOUS | Status: AC
  Filled 2013-06-18: qty 4

## 2013-06-18 MED ORDER — DIPHENHYDRAMINE HCL 25 MG PO CAPS: 25.0000 mg | ORAL_CAPSULE | Freq: Four times a day (QID) | ORAL | Status: DC | PRN

## 2013-06-18 MED ORDER — FENTANYL 2.5 MCG/ML BUPIVACAINE 1/10 % EPIDURAL INFUSION (WH - ANES): 14.0000 mL/h | INTRAMUSCULAR | Status: DC | PRN

## 2013-06-18 MED ORDER — ONDANSETRON HCL 4 MG/2ML IJ SOLN: 4.0000 mg | Freq: Four times a day (QID) | INTRAMUSCULAR | Status: DC | PRN

## 2013-06-18 MED ORDER — OXYCODONE-ACETAMINOPHEN 5-325 MG PO TABS
1.0000 | ORAL_TABLET | ORAL | Status: DC | PRN
  Administered 2013-06-19: 2 via ORAL
  Administered 2013-06-19: 1 via ORAL
  Filled 2013-06-18: qty 1
  Filled 2013-06-18: qty 2

## 2013-06-18 MED ORDER — SENNOSIDES-DOCUSATE SODIUM 8.6-50 MG PO TABS
2.0000 | ORAL_TABLET | ORAL | Status: DC
  Administered 2013-06-19 – 2013-06-20 (×3): 2 via ORAL
  Filled 2013-06-18 (×3): qty 2

## 2013-06-18 MED ORDER — WITCH HAZEL-GLYCERIN EX PADS: 1.0000 "application " | MEDICATED_PAD | CUTANEOUS | Status: DC | PRN

## 2013-06-18 MED ORDER — PRENATAL MULTIVITAMIN CH
1.0000 | ORAL_TABLET | Freq: Every day | ORAL | Status: DC
  Administered 2013-06-19: 1 via ORAL
  Filled 2013-06-18: qty 1

## 2013-06-18 MED ORDER — ZOLPIDEM TARTRATE 5 MG PO TABS
5.0000 mg | ORAL_TABLET | Freq: Every evening | ORAL | Status: DC | PRN
Start: 2013-06-18 — End: 2013-06-20

## 2013-06-18 MED ORDER — OXYTOCIN BOLUS FROM INFUSION: 500.0000 mL | INTRAVENOUS | Status: DC

## 2013-06-18 MED ORDER — IBUPROFEN 600 MG PO TABS: 600.0000 mg | ORAL_TABLET | Freq: Four times a day (QID) | ORAL | Status: DC | PRN

## 2013-06-18 MED ORDER — OXYCODONE-ACETAMINOPHEN 5-325 MG PO TABS: 1.0000 | ORAL_TABLET | ORAL | Status: DC | PRN

## 2013-06-18 MED ORDER — LIDOCAINE HCL (PF) 1 % IJ SOLN
INTRAMUSCULAR | Status: DC | PRN
  Administered 2013-06-18 (×2): 5 mL

## 2013-06-18 MED ORDER — DIBUCAINE 1 % RE OINT: 1.0000 "application " | TOPICAL_OINTMENT | RECTAL | Status: DC | PRN

## 2013-06-18 MED ORDER — FLEET ENEMA 7-19 GM/118ML RE ENEM: 1.0000 | ENEMA | RECTAL | Status: DC | PRN

## 2013-06-18 NOTE — Progress Notes (Signed)
Abigail Morgan is a 23 y.o. [phone] at 3076w3d by ultrasound admitted for active labor  Subjective: Patient resting comfortably after epidural.  Eating jello, no complaints. Not feeling contractions.  Best friend, patient's mom and grandmother at bedside.   Objective: BP 106/78  Pulse 69  Temp(Src) 97.4 F (36.3 C) (Axillary)  Resp 20  Ht 5' (1.524 m)  Wt 56.7 kg (125 lb)  BMI 24.41 kg/m2  LMP 10/21/2012      FHT:  FHR: 150 bpm, variability: moderate,  accelerations:  Present,  decelerations:  Absent UC:   none, regular, every 2-3 minutes SVE:   Dilation: 9 Effacement (%): 90 Station: 0 Exam by:: Dr. Cherlyn Cushingbeck   Vertex Bloody show on exam  Labs: Lab Results  Component Value Date   WBC 16.7* 06/18/2013   HGB 12.6 06/18/2013   HCT 35.6* 06/18/2013   MCV 85.6 06/18/2013   PLT 404* 06/18/2013    Assessment / Plan: Spontaneous labor, progressing normally  Labor: Progressing normally. AROM by Dr. Reola CalkinsBeck with mod amount of clear fluid Preeclampsia:  na Fetal Wellbeing:  Category I Pain Control:  Epidural I/D:  GBS neg Anticipated MOD:  NSVD     Abigail Morgan, Abigail Morgan 06/18/2013, 2:47 PM  I was present for ROM and agree with above.  Abigail Morgan, Abigail BasemanKELI L, MD

## 2013-06-18 NOTE — H&P (Signed)
LABOR ADMISSION HISTORY AND PHYSICAL   Abigail Morgan is a 23 y.o. female [phone] with IUP at [redacted]w[redacted]d by 21 wk [name] presenting for contractions.  These started earlier this AM but have worsened in the last 2 hours. Now unbearable.  No LOF, VB. +FM.   [facility] at [facility] since 24 wks. Normal labs and [name]. GBS neg. Seen in [facility] for hx of preterm labor. First baby born at [redacted]w[redacted]d and second at [redacted]w[redacted]d both due to PPROM.   Pt was on 17-OHP this pregnancy. Desires BTL for contraception- 30 day papers signed 05/07/13.    Prenatal History/Complications:  Past Medical History: Past Medical History  Diagnosis Date  . No pertinent past medical history   . Asthma   . Preterm labor     Past Surgical History: Past Surgical History  Procedure Laterality Date  . No past surgeries      Obstetrical History: OB History   Grav Para Term Preterm Abortions TAB SAB Ect Mult Living   4 2 0 2 1 1 0 0 0 2     see above    Social History: History   Social History  . Marital Status: Single    Spouse Name: N/A    Number of Children: N/A  . Years of Education: N/A   Social History Main Topics  . Smoking status: Former Smoker -- 0.50 packs/day for 7 years    Types: Cigarettes    Quit date: 01/23/2013  . Smokeless tobacco: Never Used  . Alcohol Use: No  . Drug Use: No  . Sexual Activity: Yes    Birth Control/ Protection: None   Other Topics Concern  . Not on file   Social History Narrative  . No narrative on file    Family History: No family history on file.  Allergies: No Known Allergies  Facility-administered medications prior to admission  Medication Dose Route Frequency Provider Last Rate Last Dose  . TDaP (BOOSTRIX) injection 0.5 mL  0.5 mL Intramuscular Once Walidah N Muhammad, CNM       Prescriptions prior to admission  Medication Sig Dispense Refill  . ondansetron (ZOFRAN ODT) 4 MG disintegrating tablet Take 1 tablet (4 mg total) by mouth every 8 (eight) hours as needed for nausea or  vomiting.  10 tablet  1  . Prenatal Vit-Fe Fumarate-FA (PRENATAL MULTIVITAMIN) TABS tablet Take 1 tablet by mouth daily at 12 noon.      . promethazine (PHENERGAN) 12.5 MG tablet Take 1 tablet (12.5 mg total) by mouth every 6 (six) hours as needed for nausea or vomiting.  30 tablet  0  . ranitidine (ZANTAC) 150 MG tablet Take 150 mg by mouth daily as needed for heartburn.          Review of Systems  All systems reviewed and negative except as stated in HPI    Blood pressure 125/77, pulse 70, temperature 97.8 F (36.6 C), temperature source Oral, resp. rate 20, last menstrual period 10/21/2012. General appearance: alert, cooperative and moderate distress with contractions Lungs: clear to auscultation bilaterally Heart: regular rate and rhythm Abdomen: soft, non-tender; bowel sounds normal Extremities: Homans sign is negative, no sign of DVT  Presentation: cephalic Fetal monitoringBaseline: 150 bpm, Variability: Good {> 6 bpm), Accelerations: Reactive and Decelerations: Absent Uterine activity regular q2 min   Dilation: 5.5 Effacement (%): 100 Station: -2 Exam by:: Sarajane Marek, RNC   Prenatal labs: ABO, Rh: O/POS/-- (09/04 1048) Antibody: NEG (09/04 1048) Rubella:   RPR: NON REAC (11/13 1034)  HBsAg: NEGATIVE (09/04 1048)  HIV: NON REACTIVE (11/13 1034)  GBS:    1 hr Glucola 96 Genetic screening  Too late Anatomy US normal     Assessment: Abigail Morgan is a 23 y.o. [phone] with an IUP at 6141w3d presenting for active labor at term.   Plan: 1) admit to L&D - routine orders - epidural prn  2) FWB - cat I tracing - GBS neg - EFW 6.5lbs   3) anticipate SVD - desires BTL    Boyd Litaker, Redmond BasemanKELI L, MD 06/18/2013, 12:34 PM

## 2013-06-18 NOTE — Anesthesia Preprocedure Evaluation (Signed)
Anesthesia Evaluation  Patient identified by MRN, date of birth, ID band Patient awake    Reviewed: Allergy & Precautions, H&P , Patient's Chart, lab work & pertinent test results  Airway Mallampati: II TM Distance: >3 FB Neck ROM: full    Dental   Pulmonary asthma , former smoker,  breath sounds clear to auscultation        Cardiovascular Rhythm:regular Rate:Normal     Neuro/Psych    GI/Hepatic   Endo/Other    Renal/GU      Musculoskeletal   Abdominal   Peds  Hematology   Anesthesia Other Findings   Reproductive/Obstetrics (+) Pregnancy                           Anesthesia Physical Anesthesia Plan  ASA: II  Anesthesia Plan: Epidural   Post-op Pain Management:    Induction:   Airway Management Planned:   Additional Equipment:   Intra-op Plan:   Post-operative Plan:   Informed Consent: I have reviewed the patients History and Physical, chart, labs and discussed the procedure including the risks, benefits and alternatives for the proposed anesthesia with the patient or authorized representative who has indicated his/her understanding and acceptance.     Plan Discussed with:   Anesthesia Plan Comments:         Anesthesia Quick Evaluation  

## 2013-06-18 NOTE — Anesthesia Procedure Notes (Signed)
Epidural Patient location during procedure: OB Start time: 06/18/2013 1:15 PM  Staffing Anesthesiologist: Brayton CavesJACKSON, Patrena Santalucia Performed by: anesthesiologist   Preanesthetic Checklist Completed: patient identified, site marked, surgical consent, pre-op evaluation, timeout performed, IV checked, risks and benefits discussed and monitors and equipment checked  Epidural Patient position: sitting Prep: site prepped and draped and DuraPrep Patient monitoring: continuous pulse ox and blood pressure Approach: midline Injection technique: LOR air  Needle:  Needle type: Tuohy  Needle gauge: 17 G Needle length: 9 cm and 9 Needle insertion depth: 5 cm cm Catheter type: closed end flexible Catheter size: 19 Gauge Catheter at skin depth: 10 cm Test dose: negative  Assessment Events: blood not aspirated, injection not painful, no injection resistance, negative IV test and no paresthesia  Additional Notes Patient identified.  Risk benefits discussed including failed block, incomplete pain control, headache, nerve damage, paralysis, blood pressure changes, nausea, vomiting, reactions to medication both toxic or allergic, and postpartum back pain.  Patient expressed understanding and wished to proceed.  All questions were answered.  Sterile technique used throughout procedure and epidural site dressed with sterile barrier dressing. No paresthesia or other complications noted.The patient did not experience any signs of intravascular injection such as tinnitus or metallic taste in mouth nor signs of intrathecal spread such as rapid motor block. Please see nursing notes for vital signs.

## 2013-06-18 NOTE — H&P (Signed)
Attestation of Attending Supervision of Obstetric Fellow: Evaluation and management procedures were performed by the Obstetric Fellow under my supervision and collaboration.  I have reviewed the Obstetric Fellow's note and chart, and I agree with the management and plan.  Danne Scardina, MD, FACOG Attending Obstetrician & Gynecologist Faculty Practice, [facility]   

## 2013-06-19 ENCOUNTER — Encounter (HOSPITAL_COMMUNITY)
Admission: AD | Disposition: A | Discharge status: Home / Self Care | Payer: Self-pay | Source: Ambulatory Visit | Attending: Obstetrics & Gynecology

## 2013-06-19 ENCOUNTER — Inpatient Hospital Stay (HOSPITAL_COMMUNITY): Payer: Medicaid Other | Admitting: Registered Nurse

## 2013-06-19 ENCOUNTER — Encounter (HOSPITAL_COMMUNITY): Payer: Medicaid Other | Admitting: Registered Nurse

## 2013-06-19 ENCOUNTER — Encounter (HOSPITAL_COMMUNITY): Payer: Self-pay

## 2013-06-19 DIAGNOSIS — O4290 Premature rupture of membranes, unspecified as to length of time between rupture and onset of labor, unspecified weeks of gestation: Secondary | ICD-10-CM | POA: Diagnosis not present

## 2013-06-19 DIAGNOSIS — Z302 Encounter for sterilization: Secondary | ICD-10-CM | POA: Diagnosis not present

## 2013-06-19 HISTORY — PX: TUBAL LIGATION: SHX77

## 2013-06-19 LAB — SURGICAL PCR SCREEN
MRSA, PCR: NEGATIVE
Staphylococcus aureus: NEGATIVE

## 2013-06-19 SURGERY — Surgical Case
Anesthesia: *Unknown

## 2013-06-19 SURGERY — SALPINGO-OOPHORECTOMY, OPEN
Anesthesia: Epidural | Laterality: Bilateral

## 2013-06-19 SURGERY — LIGATION, FALLOPIAN TUBE, POSTPARTUM
Anesthesia: Epidural | Site: Abdomen | Laterality: Bilateral

## 2013-06-19 MED ORDER — MIDAZOLAM HCL 5 MG/5ML IJ SOLN
INTRAMUSCULAR | Status: DC | PRN
  Administered 2013-06-19 (×2): 1 mg via INTRAVENOUS

## 2013-06-19 MED ORDER — MEPERIDINE HCL 25 MG/ML IJ SOLN: 6.2500 mg | INTRAMUSCULAR | Status: DC | PRN

## 2013-06-19 MED ORDER — LIDOCAINE-EPINEPHRINE (PF) 2 %-1:200000 IJ SOLN
INTRAMUSCULAR | Status: AC
  Filled 2013-06-19: qty 20

## 2013-06-19 MED ORDER — METOCLOPRAMIDE HCL 5 MG/ML IJ SOLN: 10.0000 mg | Freq: Once | INTRAMUSCULAR | Status: DC | PRN

## 2013-06-19 MED ORDER — FENTANYL CITRATE 0.05 MG/ML IJ SOLN
INTRAMUSCULAR | Status: DC | PRN
  Administered 2013-06-19 (×2): 50 ug via INTRAVENOUS

## 2013-06-19 MED ORDER — MIDAZOLAM HCL 2 MG/2ML IJ SOLN
INTRAMUSCULAR | Status: AC
  Filled 2013-06-19: qty 2

## 2013-06-19 MED ORDER — FENTANYL CITRATE 0.05 MG/ML IJ SOLN
INTRAMUSCULAR | Status: AC
  Filled 2013-06-19: qty 2

## 2013-06-19 MED ORDER — KETOROLAC TROMETHAMINE 30 MG/ML IJ SOLN: 15.0000 mg | Freq: Once | INTRAMUSCULAR | Status: DC | PRN

## 2013-06-19 MED ORDER — ONDANSETRON HCL 4 MG/2ML IJ SOLN
INTRAMUSCULAR | Status: AC
  Filled 2013-06-19: qty 2

## 2013-06-19 MED ORDER — ONDANSETRON HCL 4 MG/2ML IJ SOLN
INTRAMUSCULAR | Status: DC | PRN
  Administered 2013-06-19: 4 mg via INTRAVENOUS

## 2013-06-19 MED ORDER — BUPIVACAINE HCL 0.5 % IJ SOLN
INTRAMUSCULAR | Status: DC | PRN
  Administered 2013-06-19: 30 mL

## 2013-06-19 MED ORDER — LIDOCAINE-EPINEPHRINE (PF) 2 %-1:200000 IJ SOLN
INTRAMUSCULAR | Status: DC | PRN
  Administered 2013-06-19 (×2): 5 mL via EPIDURAL
  Administered 2013-06-19: 3 mL via EPIDURAL
  Administered 2013-06-19: 5 mL via EPIDURAL
  Administered 2013-06-19: 2 mL via EPIDURAL
  Administered 2013-06-19: 5 mL via EPIDURAL

## 2013-06-19 MED ORDER — FENTANYL CITRATE 0.05 MG/ML IJ SOLN: 25.0000 ug | INTRAMUSCULAR | Status: DC | PRN

## 2013-06-19 SURGICAL SUPPLY — 24 items
APL SKNCLS STERI-STRIP NONHPOA (GAUZE/BANDAGES/DRESSINGS) ×1
BENZOIN TINCTURE PRP APPL 2/3 (GAUZE/BANDAGES/DRESSINGS) ×2 IMPLANT
CATH ROBINSON RED A/P 16FR (CATHETERS) ×3 IMPLANT
CHLORAPREP W/TINT 26ML (MISCELLANEOUS) ×3 IMPLANT
CLIP FILSHIE TUBAL LIGA STRL (Clip) ×2 IMPLANT
CLOSURE WOUND 1/2 X4 (GAUZE/BANDAGES/DRESSINGS) ×1
CLOTH BEACON ORANGE TIMEOUT ST (SAFETY) ×3 IMPLANT
DRSG COVADERM PLUS 2X2 (GAUZE/BANDAGES/DRESSINGS) ×2 IMPLANT
GLOVE ECLIPSE 7.0 STRL STRAW (GLOVE) ×3 IMPLANT
GLOVE INDICATOR 7.0 STRL GRN (GLOVE) ×6 IMPLANT
GOWN STRL REUS W/ TWL XL LVL3 (GOWN DISPOSABLE) ×1 IMPLANT
GOWN STRL REUS W/TWL LRG LVL3 (GOWN DISPOSABLE) ×3 IMPLANT
GOWN STRL REUS W/TWL XL LVL3 (GOWN DISPOSABLE) ×3
NDL HYPO 25X1 1.5 SAFETY (NEEDLE) IMPLANT
NEEDLE HYPO 25X1 1.5 SAFETY (NEEDLE) ×3 IMPLANT
NS IRRIG 1000ML POUR BTL (IV SOLUTION) ×3 IMPLANT
PACK ABDOMINAL MINOR (CUSTOM PROCEDURE TRAY) ×3 IMPLANT
STRIP CLOSURE SKIN 1/2X4 (GAUZE/BANDAGES/DRESSINGS) ×1 IMPLANT
SUT VIC AB 0 CT1 27 (SUTURE) ×3
SUT VIC AB 0 CT1 27XBRD ANBCTR (SUTURE) ×1 IMPLANT
SUT VIC AB 4-0 PS2 27 (SUTURE) ×3 IMPLANT
SYR CONTROL 10ML LL (SYRINGE) ×2 IMPLANT
TOWEL OR 17X24 6PK STRL BLUE (TOWEL DISPOSABLE) ×6 IMPLANT
WATER STERILE IRR 1000ML POUR (IV SOLUTION) ×3 IMPLANT

## 2013-06-19 NOTE — Anesthesia Preprocedure Evaluation (Addendum)
Anesthesia Evaluation  Patient identified by MRN, date of birth, ID band Patient awake    Reviewed: Allergy & Precautions, H&P , NPO status , Patient's Chart, lab work & pertinent test results, reviewed documented beta blocker date and time   History of Anesthesia Complications Negative for: history of anesthetic complications  Airway Mallampati: II TM Distance: >3 FB Neck ROM: full    Dental  (+) Teeth Intact   Pulmonary asthma , Recent URI , Residual Cough, former smoker,  breath sounds clear to auscultation        Cardiovascular negative cardio ROS  Rhythm:regular Rate:Normal     Neuro/Psych negative neurological ROS  negative psych ROS   GI/Hepatic negative GI ROS, Neg liver ROS,   Endo/Other  negative endocrine ROS  Renal/GU negative Renal ROS  negative genitourinary   Musculoskeletal   Abdominal   Peds  Hematology negative hematology ROS (+)   Anesthesia Other Findings   Reproductive/Obstetrics (+) Pregnancy (s/p SVD yesterday)                          Anesthesia Physical  Anesthesia Plan  ASA: II  Anesthesia Plan: Epidural   Post-op Pain Management:    Induction:   Airway Management Planned:   Additional Equipment:   Intra-op Plan:   Post-operative Plan:   Informed Consent: I have reviewed the patients History and Physical, chart, labs and discussed the procedure including the risks, benefits and alternatives for the proposed anesthesia with the patient or authorized representative who has indicated his/her understanding and acceptance.     Plan Discussed with: Surgeon and CRNA  Anesthesia Plan Comments:         Anesthesia Quick Evaluation

## 2013-06-19 NOTE — Anesthesia Postprocedure Evaluation (Signed)
  Anesthesia Post-op Note  Patient: Abigail Morgan  Procedure(s) Performed: Procedure(s): POST PARTUM TUBAL LIGATION (Bilateral)  Patient Location: PACU and Mother/Baby  Anesthesia Type:Regional  Level of Consciousness: awake, alert , oriented and patient cooperative  Airway and Oxygen Therapy: Patient Spontanous Breathing  Post-op Pain: mild  Post-op Assessment: Post-op Vital signs reviewed  Post-op Vital Signs: Reviewed and stable  Complications: No apparent anesthesia complications

## 2013-06-19 NOTE — Brief Op Note (Signed)
06/18/2013 - 06/19/2013  2:22 PM  PATIENT:  Abigail FuchsAmanda Morgan  23 y.o. female  PRE-OPERATIVE DIAGNOSIS:  Desires Sterilization  POST-OPERATIVE DIAGNOSIS:  Desires Sterilization  PROCEDURE:  Procedure(s): POST PARTUM TUBAL LIGATION (Bilateral)  SURGEON:  Surgeon(s) and Role:    * Willodean Rosenthalarolyn Harraway-Smith, MD - Primary  ANESTHESIA:   epidural  EBL:  Total I/O In: 1200 [I.V.:1200] Out: 105 [Urine:100; Blood:5]  BLOOD ADMINISTERED:none  DRAINS: none   LOCAL MEDICATIONS USED:  MARCAINE     SPECIMEN:  No Specimen  DISPOSITION OF SPECIMEN:  N/A  COUNTS:  YES  TOURNIQUET:  * No tourniquets in log *  DICTATION: .Note written in EPIC  PLAN OF CARE: Pt iw already an inpatient  PATIENT DISPOSITION:  PACU - hemodynamically stable.   Delay start of Pharmacological VTE agent (>24hrs) due to surgical blood loss or risk of bleeding: not applicable

## 2013-06-19 NOTE — Care Management (Signed)
Foley not placed at beginning of case as per Dr. Erin FullingHarraway-Smith; rather an in-and-out catheter was used on patient post-op by Merilyn BabaA. Khoo, RN, as per Dr. Erin FullingHarraway-Smith, producing about 100 mL of clear, straw-colored urine

## 2013-06-19 NOTE — Progress Notes (Signed)
UR chart review completed.  

## 2013-06-19 NOTE — OR Nursing (Signed)
Delay due to waiting for epidural to come up to the right level

## 2013-06-19 NOTE — Op Note (Signed)
Abigail Morgan 06/18/2013 - 06/19/2013  PREOPERATIVE DIAGNOSIS:  Multiparity, undesired fertility  POSTOPERATIVE DIAGNOSIS:  Multiparity, undesired fertility  PROCEDURE:  Postpartum Bilateral Tubal Sterilization using Filshie Clips   ANESTHESIA:  Epidural and local analgesia using 0.5% Marcaine  COMPLICATIONS:  None immediate.  ESTIMATED BLOOD LOSS: 5 ml.  URINE OUTPUT:  S/p cath post op . INDICATIONS: 23 y.o. [id number]  with undesired fertility,status post vaginal delivery, desires permanent sterilization.  Other reversible forms of contraception were discussed with patient; she declines all other modalities. Risks of procedure discussed with patient including but not limited to: risk of regret, permanence of method, bleeding, infection, injury to surrounding organs and need for additional procedures.  Failure risk of 3-08/998 with increased risk of ectopic gestation if pregnancy occurs was also discussed with patient.     FINDINGS:  Normal uterus, tubes, and ovaries.  PROCEDURE DETAILS: The patient was taken to the operating room where her epidural anesthesia was dosed up to surgical level and found to be adequate.  She was then placed in the dorsal supine position and prepped and draped in sterile fashion.  After an adequate timeout was performed, 20cc of 0.5% marcaine was injected into the umbilical fold.  Attention was then  turned to the patient's abdomen where a small transverse skin incision was made under the umbilical fold. The incision was taken down to the layer of fascia using the scalpel, and fascia was incised, and extended bilaterally using Mayo scissors. The peritoneum was entered in a sharp fashion. Attention was then turned to the patient's uterus, and left fallopian tube was identified and followed out to the fimbriated end.  A Filshie clip was placed on the left fallopian tube about 3 cm from the cornual attachment, with care given to incorporate the underlying mesosalpinx.  A  similar process was carried out on the right side allowing for bilateral tubal sterilization.  Good hemostasis was noted overall.  The instruments were then removed from the patient's abdomen and the fascial incision was repaired with 0 Vicryl, and the skin was closed with a 4-0 Vicryl subcuticular stitch. Benzoin and steristrips were applied. The patient tolerated the procedure well.  Instrument, sponge, and needle counts were correct times two.  The patient was then taken to the recovery room awake and in stable condition.

## 2013-06-19 NOTE — Transfer of Care (Signed)
Immediate Anesthesia Transfer of Care Note  Patient: Abigail Morgan  Procedure(s) Performed: Procedure(s): POST PARTUM TUBAL LIGATION (Bilateral)  Patient Location: PACU  Anesthesia Type:Epidural  Level of Consciousness: awake  Airway & Oxygen Therapy: Patient Spontanous Breathing  Post-op Assessment: Report given to PACU RN and Post -op Vital signs reviewed and stable  Post vital signs: stable  Complications: No apparent anesthesia complications

## 2013-06-19 NOTE — Lactation Note (Signed)
This note was copied from the chart of Abigail Morgan. Lactation Consultation Note Iniital consult:  [name] 518 hours old.  Mother states she wants to breast feed but he will not latch so has been formula feeding.  Mother is leaving for a BTL.  Reviewed sleepy babies, cluster feeding, STS, lactation support services and brochure.  Encouraged mother to call for further assistance.    Patient Name: Abigail Morgan's Date: 06/19/2013 Reason for consult: Initial assessment   Maternal Data Infant to breast within first hour of birth: Yes Has patient been taught Hand Expression?: Yes Does the patient have breastfeeding experience prior to this delivery?: No  Feeding    LATCH Score/Interventions                      Lactation Tools Discussed/Used     Consult Status Consult Status: PRN    Hardie PulleyBerkelhammer, Hollis Oh Boschen 06/19/2013, 11:34 AM

## 2013-06-19 NOTE — Anesthesia Postprocedure Evaluation (Signed)
  Anesthesia Post-op Note  Anesthesia Post Note  Patient: Abigail Morgan  Procedure(s) Performed: * No procedures listed *  Anesthesia type: Epidural  Patient location: Mother-Baby  Post pain: Pain level controlled  Post assessment: Post-op Vital signs reviewed  Last Vitals:  Filed Vitals:   06/19/13 1035  BP: 127/79  Pulse: 69  Temp: 36.7 C  Resp: 16    Post vital signs: stable  Level of consciousness: awake  Complications: No apparent anesthesia complications.  To go for PPBTL today

## 2013-06-19 NOTE — Lactation Note (Signed)
This note was copied from the chart of Boy Christiana Fuchsmanda Lambert. Lactation Consultation Note  Patient Name: Boy Christiana Fuchsmanda Lawless ZOXWR'UToday's Date: 06/19/2013 Reason for consult: Other (Comment) (unable to exclude based on mom's choice) but RN, Victorino DikeJennifer was informed by this mom that she may pump for a few days to give colostrum but then will exclusively formula feed baby.  RN will check to see if she still plans to pump and determine if she needs pump.     Maternal Data    Feeding Feeding Type: Formula  LATCH Score/Interventions            N/A - baby has been only bottle-feeding with only one breastfeeding attempt over 24 hours ago          Lactation Tools Discussed/Used   N/A - no visit (already received initial LC visit today)  Consult Status Consult Status: Complete    Lynda RainwaterBryant, Jesenya Bowditch Parmly 06/19/2013, 9:22 PM

## 2013-06-19 NOTE — Anesthesia Postprocedure Evaluation (Signed)
  Anesthesia Post-op Note  Patient: Abigail Morgan  Procedure(s) Performed: Procedure(s): POST PARTUM TUBAL LIGATION (Bilateral)  Patient is awake, responsive, moving her legs, and has signs of resolution of her numbness. Pain and nausea are reasonably well controlled. Vital signs are stable and clinically acceptable. Oxygen saturation is clinically acceptable. There are no apparent anesthetic complications at this time. Patient is ready for discharge.

## 2013-06-19 NOTE — Progress Notes (Signed)
Patient ID: Christiana Fuchsmanda Estabrooks, female   DOB: 11/28/1990, 23 y.o.   MRN: [id number] Patient desires surgical management with post partum tubal ligation with Filshie clips.  The risks of surgery were discussed in detail with the patient including but not limited to: bleeding which may require transfusion or reoperation; infection which may require prolonged hospitalization or re-hospitalization and antibiotic therapy; injury to bowel, bladder, ureters and major vessels or other surrounding organs; need for additional procedures including laparotomy; thromboembolic phenomenon, incisional problems and other postoperative or anesthesia complications.  Patient was told that the failure rate is 3-08/998 and that if she does get pregnant that the risk of ectopic pregnancy is higher; the postoperative expectations were also discussed in detail. The patient also understands the alternative treatment options which were discussed in full. All questions were answered.

## 2013-06-19 NOTE — Progress Notes (Signed)
Post Partum Day 1 Subjective: no complaints, up ad lib and voiding Pt has been NPO for PPBTL  Objective: Blood pressure 127/79, pulse 69, temperature 98.1 F (36.7 C), temperature source Oral, resp. rate 16, height 5' (1.524 m), weight 125 lb (56.7 kg), last menstrual period 10/21/2012, SpO2 100.00%, unknown if currently breastfeeding.  Physical Exam:  General: alert and no distress Lochia: appropriate  See pre op discussion note   Recent Labs  06/18/13 1248  HGB 12.6  HCT 35.6*    Assessment/Plan: Contraception postpartum sterilization.  Pt undecided about going home today vs tomorrow.  Will let me know after her BTL    LOS: 1 day   HARRAWAY-SMITH, Branon Sabine 06/19/2013, 11:57 AM

## 2013-06-20 ENCOUNTER — Encounter (HOSPITAL_COMMUNITY): Payer: Self-pay | Admitting: Obstetrics & Gynecology

## 2013-06-20 MED ORDER — OXYCODONE-ACETAMINOPHEN 5-325 MG PO TABS: 1.0000 | ORAL_TABLET | ORAL | Status: DC | PRN

## 2013-06-20 MED ORDER — IBUPROFEN 600 MG PO TABS: 600.0000 mg | ORAL_TABLET | Freq: Four times a day (QID) | ORAL | Status: DC

## 2013-06-20 NOTE — Discharge Instructions (Signed)
Laparoscopic Tubal Ligation °Care After °Refer to this sheet in the next few weeks. These instructions provide you with information on caring for yourself after your procedure. Your caregiver may also give you more specific instructions. Your treatment has been planned according to current medical practices, but problems sometimes occur. Call your caregiver if you have any problems or questions after your procedure. °HOME CARE INSTRUCTIONS  °· Rest the remainder of the day. °· Only take over-the-counter or prescription medicines for pain, discomfort, or fever as directed by your caregiver. Do not take aspirin. It can cause bleeding. °· Gradually resume daily activities, diet, rest, driving, and work. °· Avoid sexual intercourse for 2 weeks or as directed. °· Do not use tampons or douche. °· Do not drive while taking pain medicine. °· Do not lift anything over 5 pounds for 2 weeks or as directed. °· Only take showers, not baths, until you are seen by your caregiver. °· Change bandages (dressings) as directed. °· Take your temperature twice a day and record it. °· Try to have help for the first 7 to 10 days for your household needs. °· Return to your caregiver to get your stitches (sutures) removed and for follow-up visits as directed. °SEEK MEDICAL CARE IF:  °· You have redness, swelling, or increasing pain in a wound. °· You have drainage from a wound lasting longer than 1 day. °· Your pain is getting worse. °· You have a rash. °· You become dizzy or lightheaded. °· You have a reaction to your medicine. °· You need stronger medicine or a change in your pain medicine. °· You notice a bad smell coming from a wound or dressing. °· Your wound breaks open after the sutures have been removed. °· You are constipated. °SEEK IMMEDIATE MEDICAL CARE IF:  °· You faint. °· You have a fever. °· You have increasing abdominal pain. °· You have severe pain in your shoulders. °· You have bleeding or drainage from the suture sites or  vagina following surgery. °· You have shortness of breath or difficulty breathing. °· You have chest or leg pain. °· You have persistent nausea, vomiting, or diarrhea. °MAKE SURE YOU:  °· Understand these instructions. °· Watch your condition. °· Get help right away if you are not doing well or get worse. °Document Released: 11/26/2004 Document Revised: 11/08/2011 Document Reviewed: 08/20/2011 °Exit[employer]® Patient Information ©2014 [employer]. °Vaginal Delivery °Care After °Refer to this sheet in the next few weeks. These discharge instructions provide you with information on caring for yourself after delivery. Your caregiver may also give you specific instructions. Your treatment has been planned according to the most current medical practices available, but problems sometimes occur. Call your caregiver if you have any problems or questions after you go home. °HOME CARE INSTRUCTIONS °· Take over-the-counter or prescription medicines only as directed by your caregiver or pharmacist. °· Do not drink alcohol, especially if you are breastfeeding or taking medicine to relieve pain. °· Do not chew or smoke tobacco. °· Do not use illegal drugs. °· Continue to use good perineal care. Good perineal care includes: °· Wiping your perineum from front to back. °· Keeping your perineum clean. °· Do not use tampons or douche until your caregiver says it is okay. °· Shower, wash your hair, and take tub baths as directed by your caregiver. °· Wear a well-fitting bra that provides breast support. °· Eat healthy foods. °· Drink enough fluids to keep your urine clear or pale yellow. °· Eat   high-fiber foods such as whole grain cereals and breads, brown rice, beans, and fresh fruits and vegetables every day. These foods may help prevent or relieve constipation. °· Follow your cargiver's recommendations regarding resumption of activities such as climbing stairs, driving, lifting, exercising, or traveling. °· Talk to your caregiver about  resuming sexual activities. Resumption of sexual activities is dependent upon your risk of infection, your rate of healing, and your comfort and desire to resume sexual activity. °· Try to have someone help you with your household activities and your newborn for at least a few days after you leave the hospital. °· Rest as much as possible. Try to rest or take a nap when your newborn is sleeping. °· Increase your activities gradually. °· Keep all of your scheduled postpartum appointments. It is very important to keep your scheduled follow-up appointments. At these appointments, your caregiver will be checking to make sure that you are healing physically and emotionally. °SEEK MEDICAL CARE IF:  °· You are passing large clots from your vagina. Save any clots to show your caregiver. °· You have a foul smelling discharge from your vagina. °· You have trouble urinating. °· You are urinating frequently. °· You have pain when you urinate. °· You have a change in your bowel movements. °· You have increasing redness, pain, or swelling near your vaginal incision (episiotomy) or vaginal tear. °· You have pus draining from your episiotomy or vaginal tear. °· Your episiotomy or vaginal tear is separating. °· You have painful, hard, or reddened breasts. °· You have a severe headache. °· You have blurred vision or see spots. °· You feel sad or depressed. °· You have thoughts of hurting yourself or your newborn. °· You have questions about your care, the care of your newborn, or medicines. °· You are dizzy or lightheaded. °· You have a rash. °· You have nausea or vomiting. °· You were breastfeeding and have not had a menstrual period within 12 weeks after you stopped breastfeeding. °· You are not breastfeeding and have not had a menstrual period by the 12th week after delivery. °· You have a fever. °SEEK IMMEDIATE MEDICAL CARE IF:  °· You have persistent pain. °· You have chest pain. °· You have shortness of breath. °· You  faint. °· You have leg pain. °· You have stomach pain. °· Your vaginal bleeding saturates two or more sanitary pads in 1 hour. °MAKE SURE YOU:  °· Understand these instructions. °· Will watch your condition. °· Will get help right away if you are not doing well or get worse. °Document Released: 05/06/2000 Document Revised: 02/01/2012 Document Reviewed: 01/04/2012 °Exit[employer]® Patient Information ©2014 [employer]. ° °

## 2013-06-20 NOTE — Discharge Summary (Signed)
Obstetric Discharge Summary Reason for Admission: onset of labor Prenatal Procedures: NST Intrapartum Procedures: spontaneous vaginal delivery Postpartum Procedures: P.P. tubal ligation Complications-Operative and Postpartum: labial laceration Hemoglobin  Date Value Range Status  06/18/2013 12.6  12.0 - 15.0 g/dL Final     HCT  Date Value Range Status  06/18/2013 35.6* 36.0 - 46.0 % Final   Ms Abigail Morgan is a 23yo [id number] at 38.3wks who presented in active labor during the day on 06/18/13. She progressed well to SVD that same afternoon and had a tubal ligation on 1/28. On PPD#2/POD#1 she is dong well and is deemed to have received the full benefit of her hospital stay and will be discharged home. She is bottlefeeding.  Physical Exam:  General: alert, cooperative and no distress Lochia: appropriate Uterine Fundus: firm Incision: healing well DVT Evaluation: No evidence of DVT seen on physical exam.  Discharge Diagnoses: Term Pregnancy-delivered  Discharge Information: Date: 06/20/2013 Activity: pelvic rest Diet: routine Medications: Ibuprofen Condition: stable Instructions: refer to practice specific booklet Discharge to: home   Newborn Data: Live born female  Birth Weight: 6 lb 2.8 oz (2800 g) APGAR: 8, 9  Home with mother.  Muazu, Aisha 06/20/2013, 6:02 AM  I have seen and examined this patient and I agree with the above. Cam HaiSHAW, Avery Klingbeil 7:31 AM 06/20/2013

## 2013-08-04 ENCOUNTER — Encounter: Payer: Self-pay | Admitting: *Deleted

## 2014-01-17 ENCOUNTER — Encounter: Payer: Self-pay | Admitting: General Practice

## 2014-01-21 IMAGING — US US OB COMP +14 WK
1 series · 12 of 28 positions shown · non-contrast
Comparison: none

[Series 1: us ob comp +14 wk · 46 acquisitions, 12 frames shown]
[im 2/46]
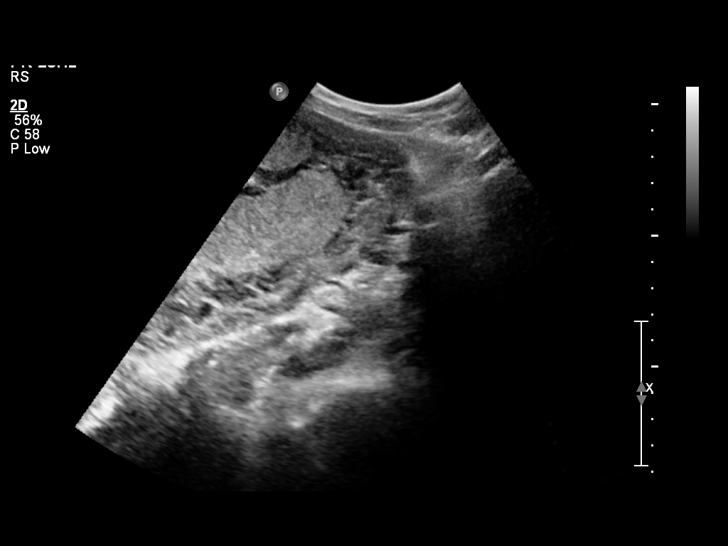
[im 6/46]
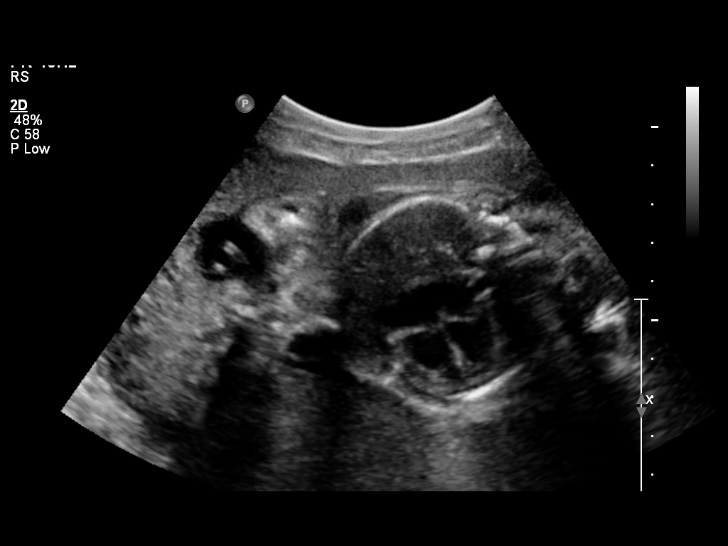
[im 9/46]
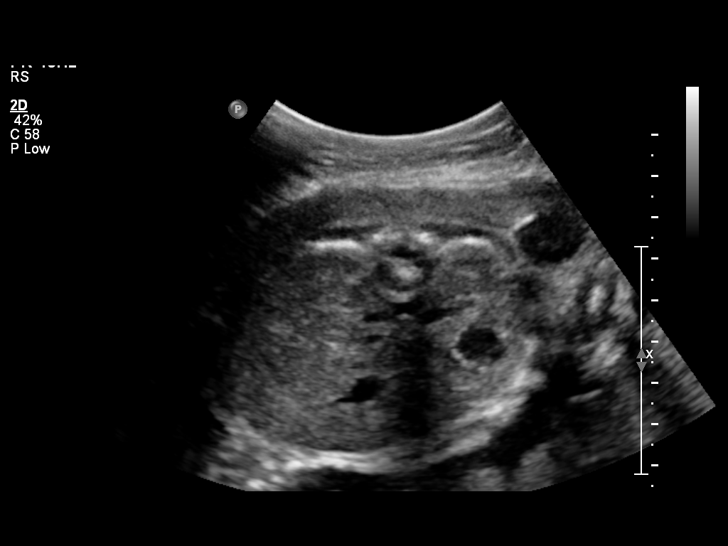
[im 14/46]
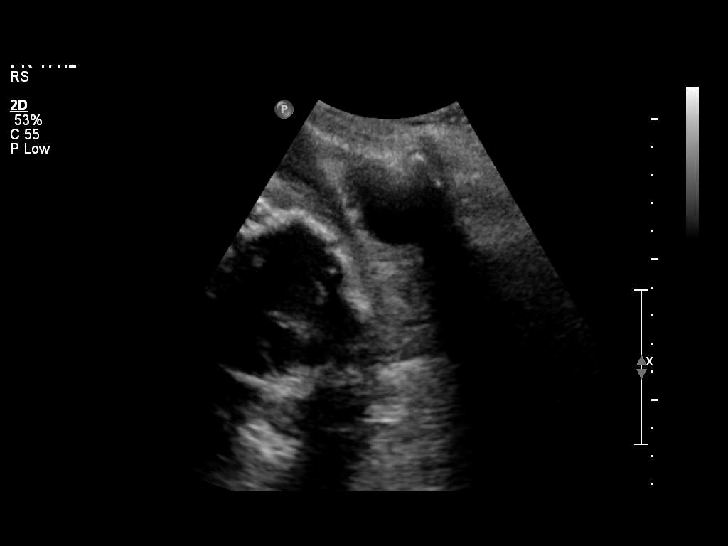
[im 17/46]
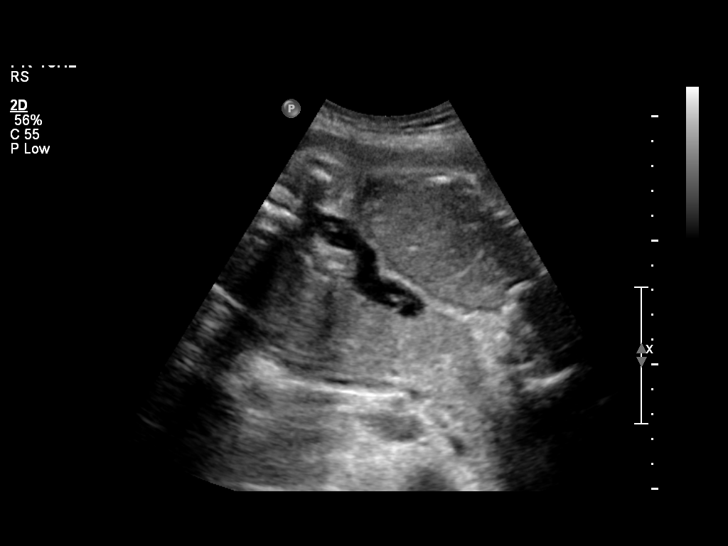
[im 21/46]
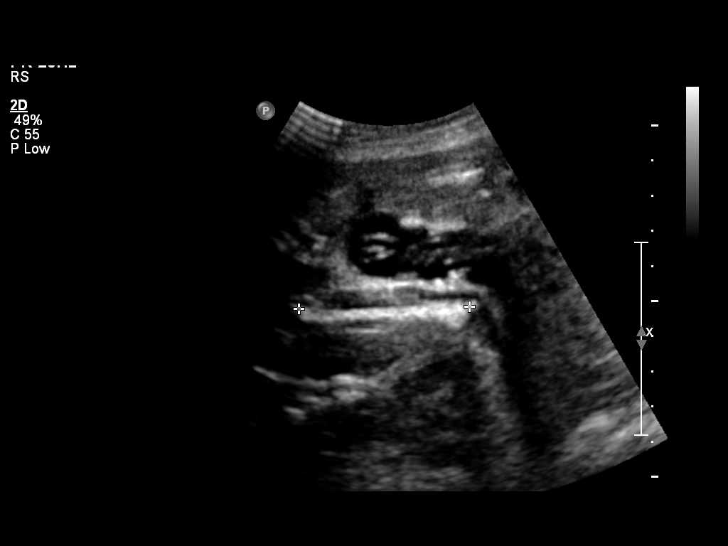
[im 26/46]
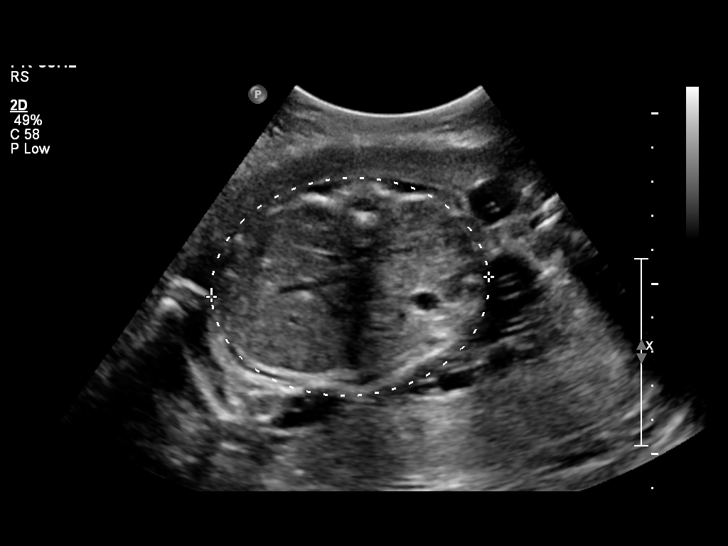
[im 29/46]
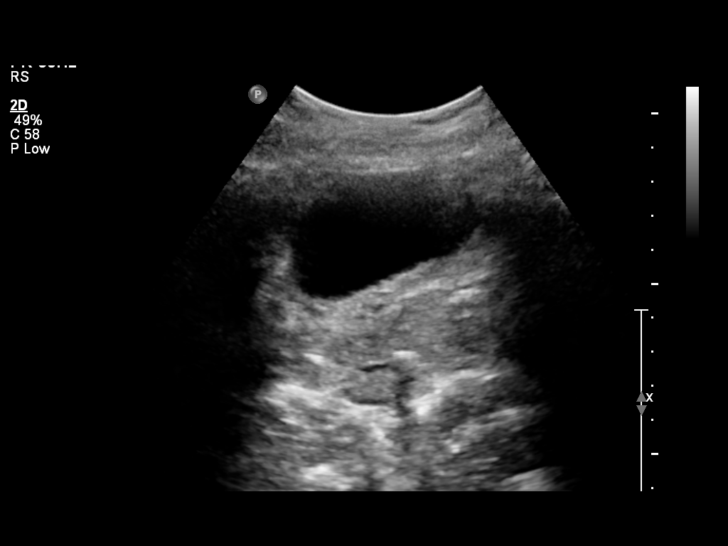
[im 32/46]
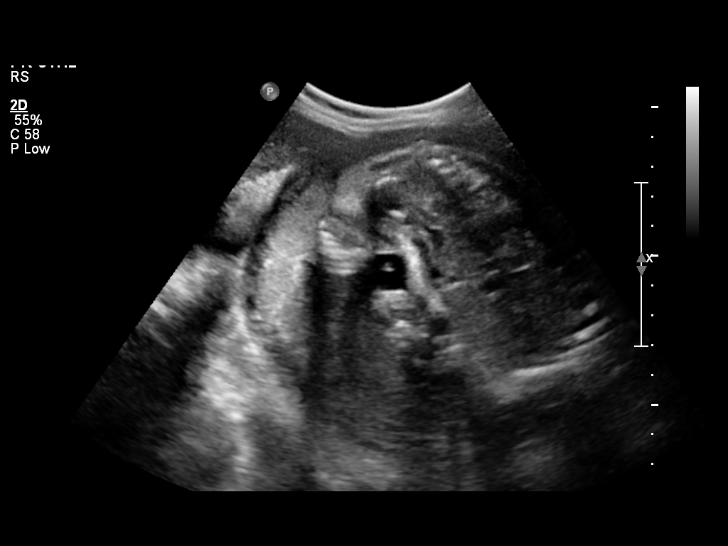
[im 37/46]
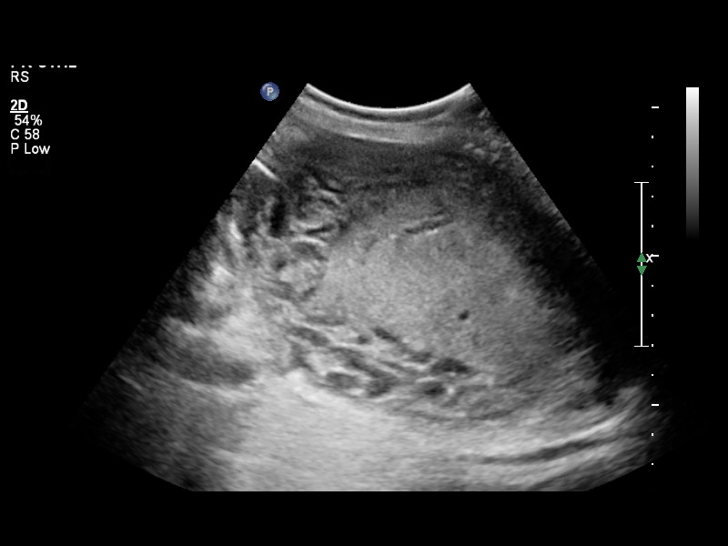
[im 41/46]
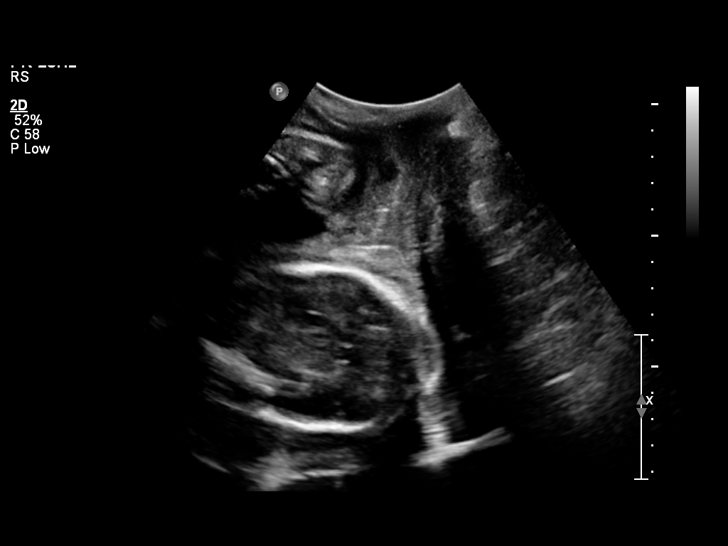
[im 44/46]
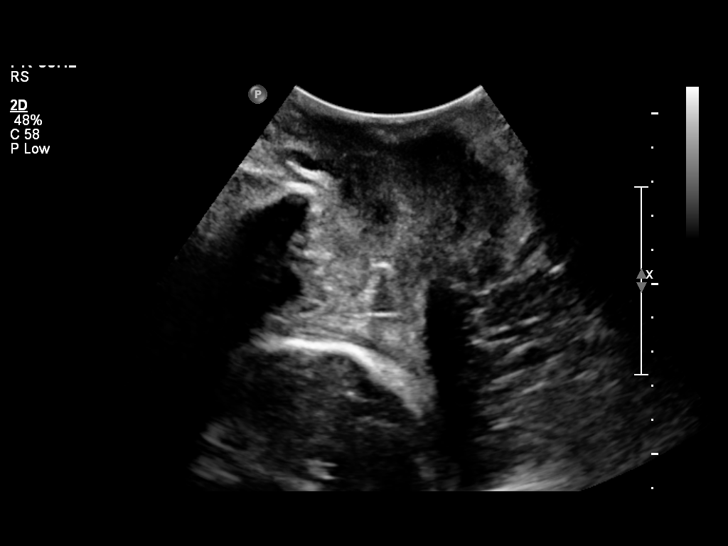

[12 of 28 positions shown; findings below may reference images not displayed]

OBSTETRICS REPORT
                      (Signed Final 03/01/2012 [DATE])

Service(s) Provided

 US OB COMP + 14 WK                                    76805.1
Indications

 Premature rupture of membranes - leaking fluid
 Poor obstetric history: Previous preterm delivery
 Vaginal bleeding, unknown etiology
 Cigarette smoker
Fetal Evaluation

 Num Of Fetuses:    1
 Fetal Heart Rate:  142                         bpm
 Cardiac Activity:  Observed
 Presentation:      Cephalic
 Placenta:          Posterior, above cervical
                    os
 AFI Sum:     1.33    cm      < 3  %Tile     Larg Pckt:   1.33   cm
 LUQ:   1.33   cm
Biometry

 BPD:     61.6  mm    G. Age:   25w 0d                CI:        78.25   70 - 86
                                                      FL/HC:      21.2   18.6 -

 HC:     220.3  mm    G. Age:   24w 0d      < 3  %    HC/AC:      0.97   1.04 -

 AC:     227.9  mm    G. Age:   27w 1d       64  %    FL/BPD:     75.8   71 - 87
 FL:      46.7  mm    G. Age:   25w 4d       14  %    FL/AC:      20.5   20 - 24

 Est. FW:     900  gm          2 lb      48  %
Gestational Age

 Clinical EDD:  26w 3d                                        EDD:   06/04/12
 U/S Today:     25w 3d                                        EDD:   06/11/12
 Best:          26w 3d    Det. By:   Clinical EDD             EDD:   06/04/12
Anatomy

 Cranium:          Not well visualized    Aortic Arch:      Basic anatomy
                                                            exam per order
 Fetal Cavum:      Not well visualized    Ductal Arch:      Basic anatomy
                                                            exam per order
 Ventricles:       Not well visualized    Diaphragm:        Not well visualized
 Choroid Plexus:   Not well visualized    Stomach:          Appears normal, left
                                                            sided
 Cerebellum:       Not well visualized    Abdomen:          Not well visualized
 Posterior Fossa:  Not well visualized    Abdominal Wall:   Not well visualized
 Nuchal Fold:      Not applicable (>20    Cord Vessels:     Not well visualized
                   wks GA)
 Face:             Not well visualized    Kidneys:          Appear normal
 Lips:             Not well visualized    Bladder:          Appears normal
 Heart:            Appears normal         Spine:            Not well visualized
                   (4CH, axis, and
                   situs)
 RVOT:             Not well visualized    Lower             Not well visualized
                                          Extremities:
 LVOT:             Not well visualized    Upper             Not well visualized
                                          Extremities:

 Other:  Technically difficult due to low amniotic fluid. Technically difficult due
         to fetal position.
Cervix Uterus Adnexa

 Cervix:       No well visualized.

 Adnexa:     No abnormality visualized.
Impression

 Single intrauterine gestation demonstrating an estimated
 gestational age by ultrasound of 25w 3d. This is correlated
 with expected EGA by Clinical EDD of 26w 3d. EFW is
 currently at the 48%.

 Anatomic assessment is compromised by low amniotic fluid
 volume combined with fetal position. Visualized anatomy
 appears normal.

 Subjectively and quantitatively decreased amniotic fluid
 volume with only a single pocket measuring 1.3 x 3.4 cm
 seen.

 Suboptimal visualization of the cervix was possible both
 transabdominally and translabially. TV was not performed
 due to history of PROM

 questions or concerns.

## 2014-03-24 ENCOUNTER — Encounter (HOSPITAL_COMMUNITY): Payer: Self-pay | Admitting: Obstetrics & Gynecology

## 2015-01-26 IMAGING — US US OB TRANSVAGINAL
1 series · 14 of 18 positions shown · non-contrast
Comparison: none

[Series 1: us ob transvaginal · 14 of 18 slices shown]
[im 1/18]
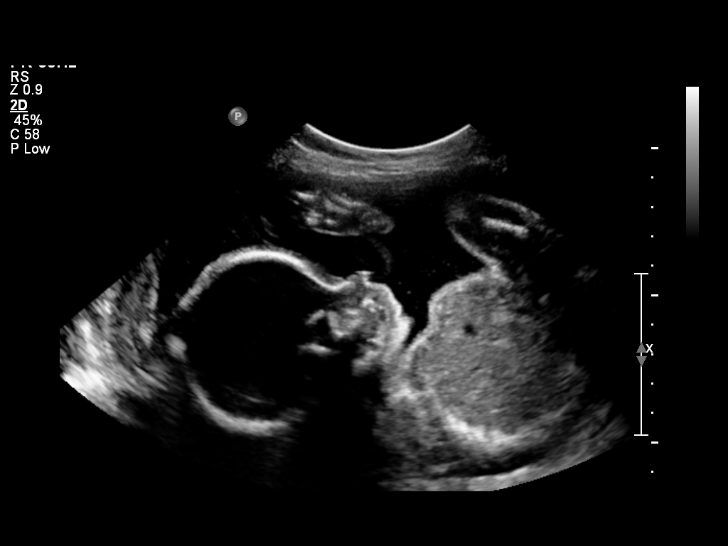
[im 2/18]
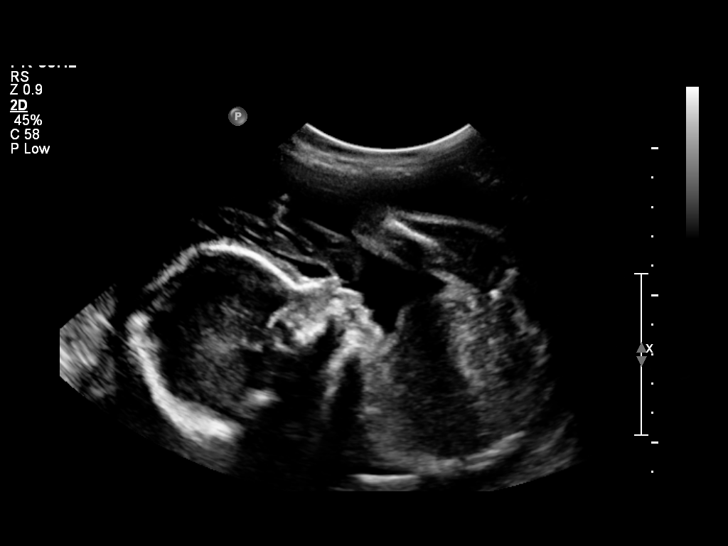
[im 4/18]
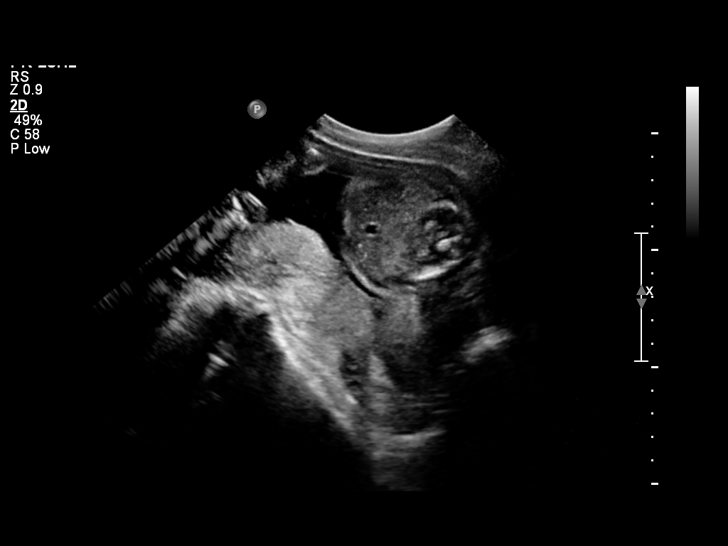
[im 5/18]
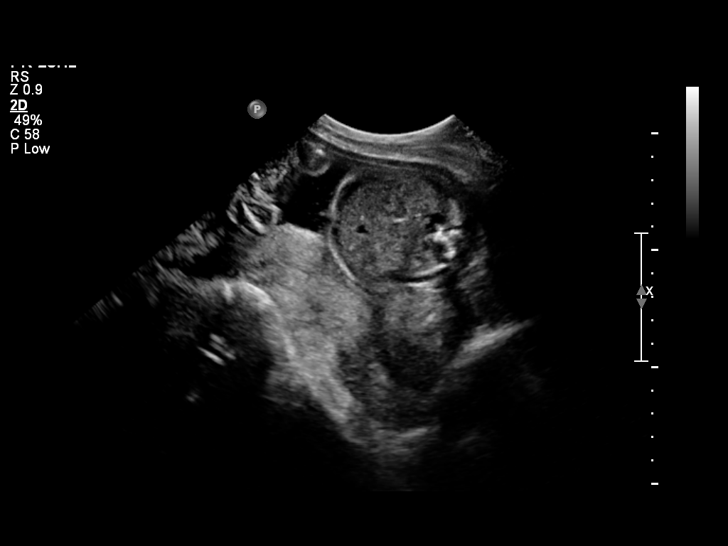
[im 6/18]
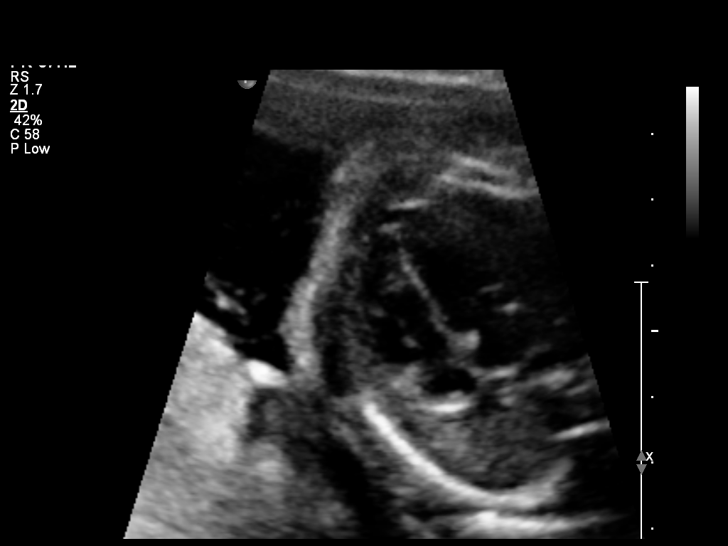
[im 8/18]
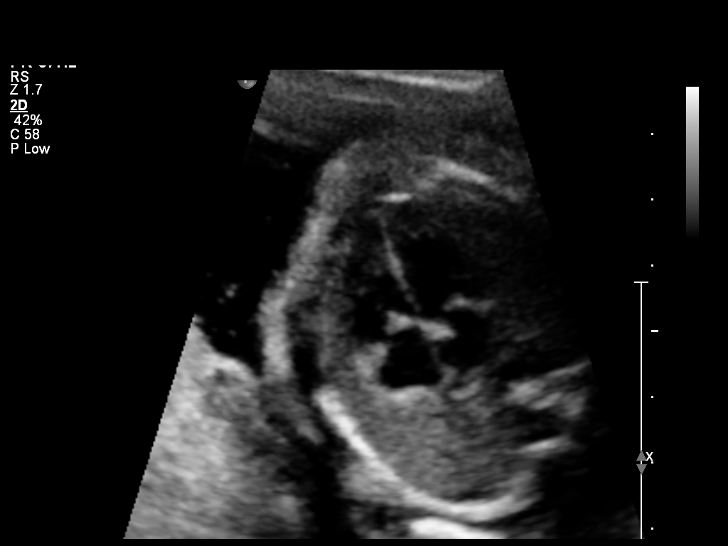
[im 9/18]
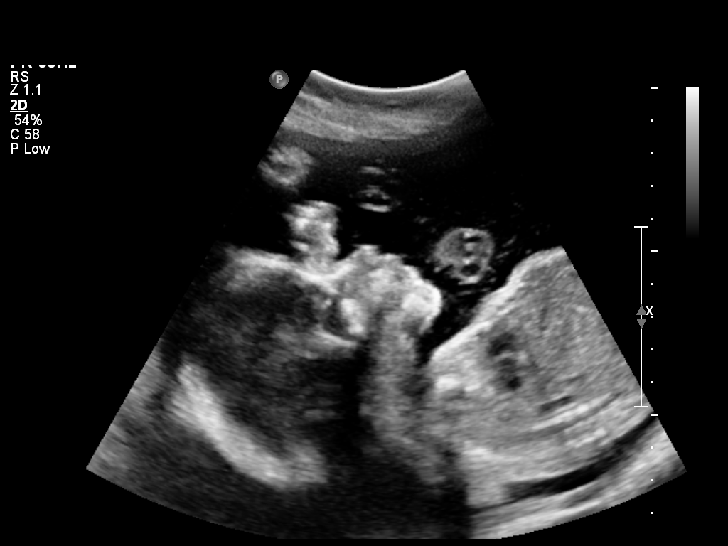
[im 10/18]
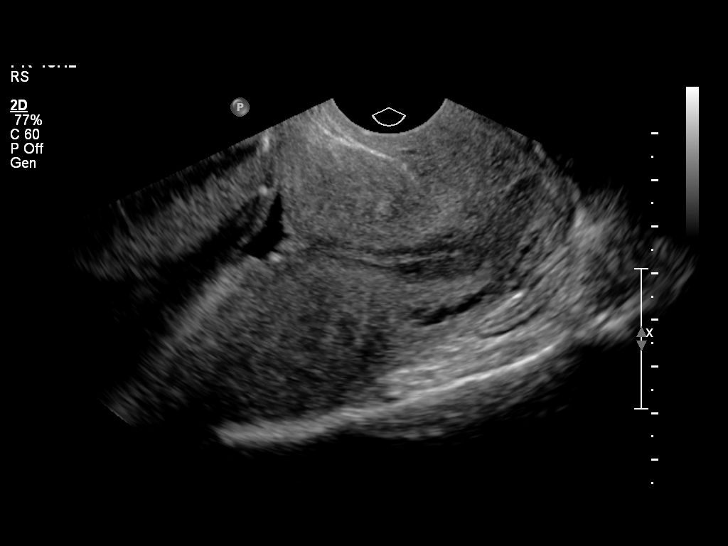
[im 11/18]
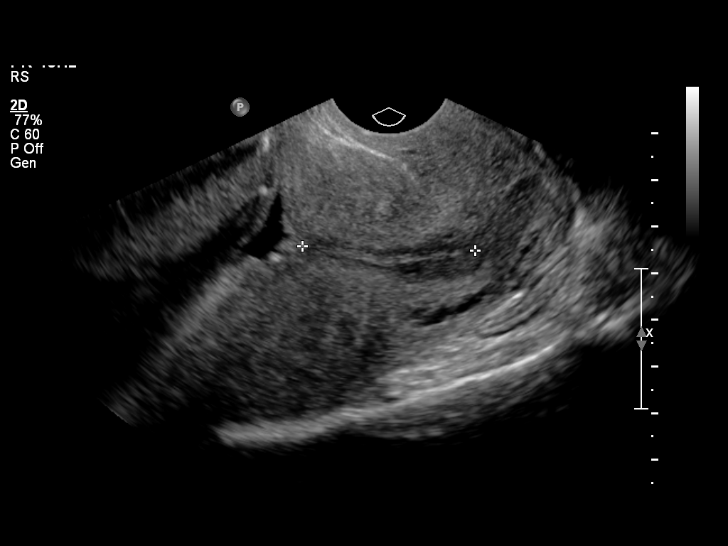
[im 13/18]
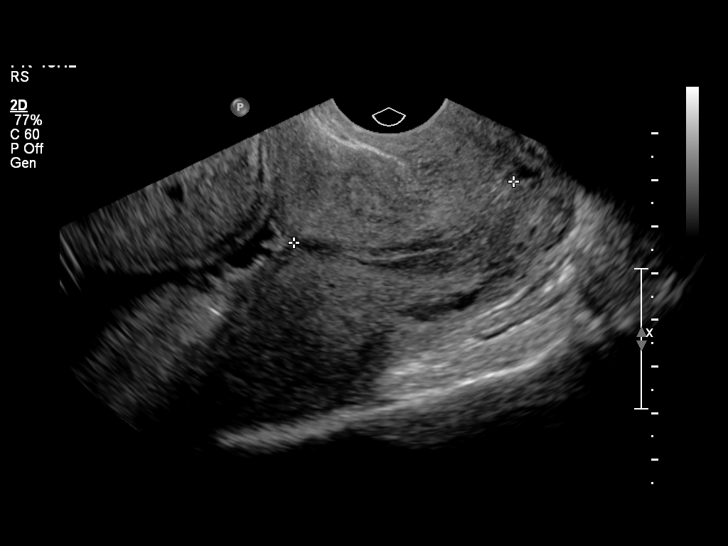
[im 14/18]
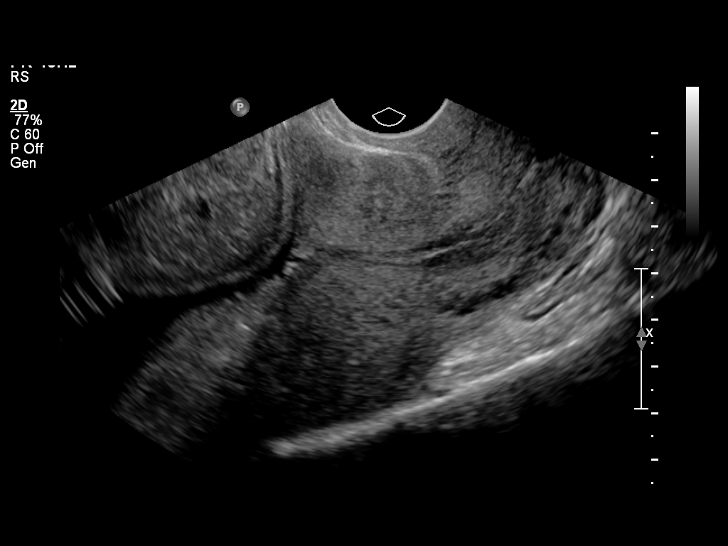
[im 15/18]
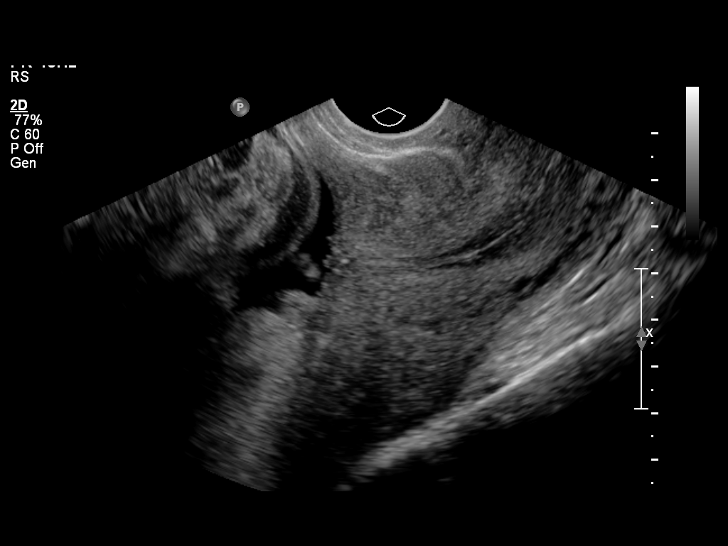
[im 17/18]
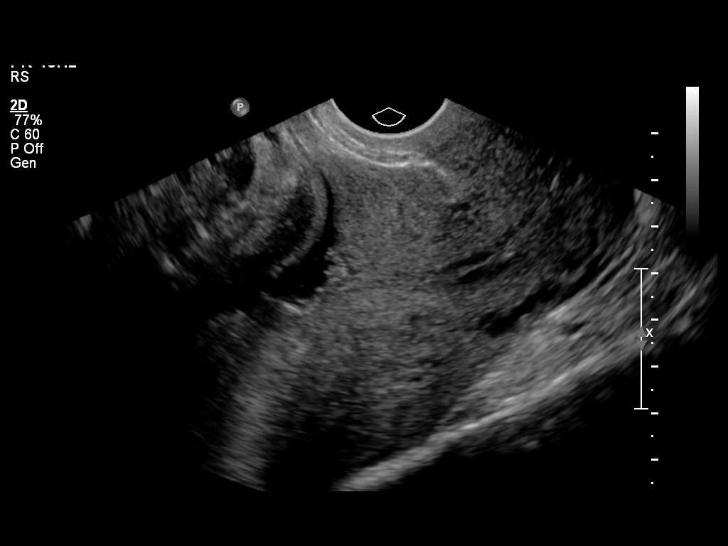
[im 18/18]
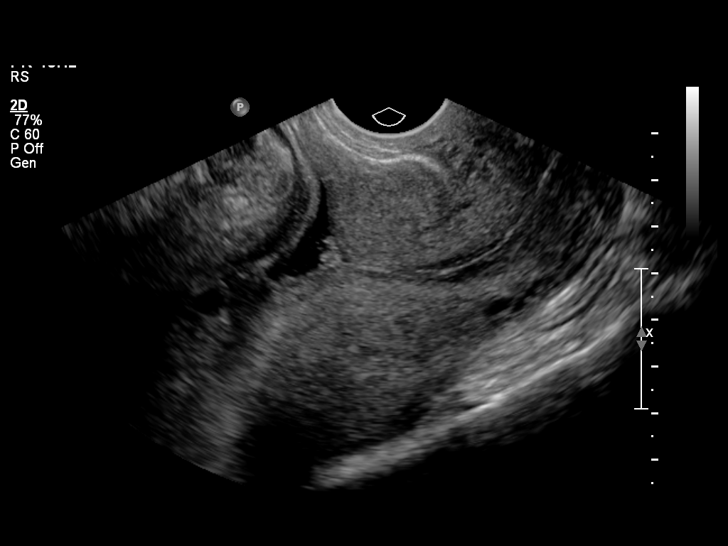

[14 of 18 positions shown; findings below may reference images not displayed]

OBSTETRICS REPORT
                      (Signed Final 03/06/2013 [DATE])

Service(s) Provided

 US OB TRANSVAGINAL                                    76817.0
Indications

 Poor obstetric history: Previous preterm delivery
 (35 and 26 weeks)
 Cigarette smoker
Fetal Evaluation

 Num Of Fetuses:    1
 Fetal Heart Rate:  155                          bpm
 Cardiac Activity:  Observed
 Presentation:      Frank breech
Gestational Age

 LMP:           19w 3d        Date:  10/21/12                 EDD:   07/28/13
 Best:          23w 4d     Det. By:  U/S (02/20/13)           EDD:   06/29/13
Cervix Uterus Adnexa

 Cervical Length:    4.89     cm

 Cervix:       Normal appearance by transvaginal scan
Impression

 Single living intrauterine pregnancy at 23 weeks 2 days.
 Normal transvaginal cervical length of 4.89 cm.
 Low lying posterior placenta 0.8 cm from the internal os.
Recommendations

 Recommend follow-up ultrasound examination in 2 weeks to
 reassess cervical length and fetal growth.

 questions or concerns.
                Francois, Midge

## 2016-06-02 ENCOUNTER — Emergency Department (HOSPITAL_COMMUNITY)
Admission: EM | Admit: 2016-06-02 | Discharge: 2016-06-02 | Disposition: A | Discharge status: Home / Self Care | Payer: Medicaid Other | Attending: Emergency Medicine | Admitting: Emergency Medicine

## 2016-06-02 ENCOUNTER — Encounter (HOSPITAL_COMMUNITY): Payer: Self-pay | Admitting: Emergency Medicine

## 2016-06-02 DIAGNOSIS — Z5321 Procedure and treatment not carried out due to patient leaving prior to being seen by health care provider: Secondary | ICD-10-CM | POA: Insufficient documentation

## 2016-06-02 DIAGNOSIS — R509 Fever, unspecified: Secondary | ICD-10-CM | POA: Insufficient documentation

## 2016-06-02 MED ORDER — ACETAMINOPHEN 325 MG PO TABS
ORAL_TABLET | ORAL | Status: AC
  Filled 2016-06-02: qty 2

## 2016-06-02 MED ORDER — ACETAMINOPHEN 325 MG PO TABS
650.0000 mg | ORAL_TABLET | Freq: Once | ORAL | Status: AC | PRN
  Administered 2016-06-02: 650 mg via ORAL

## 2016-06-02 NOTE — ED Notes (Signed)
Pt called for in waiting area for vital sign reassessment. No answer no response. 

## 2016-06-02 NOTE — ED Notes (Signed)
Pt called for x3 for vital signs. No answer

## 2016-06-02 NOTE — ED Triage Notes (Signed)
Pt c/o flu like symptoms started last night, cough, nasal congestion, fever, body aches, headache. Has been taking tylenol cold and flu without relief

## 2016-09-27 ENCOUNTER — Encounter (HOSPITAL_COMMUNITY): Payer: Self-pay | Admitting: *Deleted

## 2016-09-27 ENCOUNTER — Emergency Department (HOSPITAL_COMMUNITY)
Admission: EM | Admit: 2016-09-27 | Discharge: 2016-09-27 | Disposition: A | Discharge status: Home / Self Care | Payer: Medicaid Other | Attending: Emergency Medicine | Admitting: Emergency Medicine

## 2016-09-27 DIAGNOSIS — Z79899 Other long term (current) drug therapy: Secondary | ICD-10-CM | POA: Insufficient documentation

## 2016-09-27 DIAGNOSIS — J45909 Unspecified asthma, uncomplicated: Secondary | ICD-10-CM | POA: Insufficient documentation

## 2016-09-27 DIAGNOSIS — X58XXXA Exposure to other specified factors, initial encounter: Secondary | ICD-10-CM | POA: Insufficient documentation

## 2016-09-27 DIAGNOSIS — Y939 Activity, unspecified: Secondary | ICD-10-CM | POA: Insufficient documentation

## 2016-09-27 DIAGNOSIS — Y929 Unspecified place or not applicable: Secondary | ICD-10-CM | POA: Insufficient documentation

## 2016-09-27 DIAGNOSIS — S0502XA Injury of conjunctiva and corneal abrasion without foreign body, left eye, initial encounter: Secondary | ICD-10-CM | POA: Insufficient documentation

## 2016-09-27 DIAGNOSIS — Z87891 Personal history of nicotine dependence: Secondary | ICD-10-CM | POA: Insufficient documentation

## 2016-09-27 DIAGNOSIS — Y999 Unspecified external cause status: Secondary | ICD-10-CM | POA: Insufficient documentation

## 2016-09-27 MED ORDER — FLUORESCEIN SODIUM 0.6 MG OP STRP
1.0000 | ORAL_STRIP | Freq: Once | OPHTHALMIC | Status: AC
  Administered 2016-09-27: 1 via OPHTHALMIC
  Filled 2016-09-27: qty 1

## 2016-09-27 MED ORDER — ERYTHROMYCIN 5 MG/GM OP OINT
1.0000 "application " | TOPICAL_OINTMENT | Freq: Once | OPHTHALMIC | Status: AC
  Administered 2016-09-27: 1 via OPHTHALMIC
  Filled 2016-09-27: qty 3.5

## 2016-09-27 NOTE — ED Notes (Signed)
Pt did not need anything at this time  

## 2016-09-27 NOTE — ED Triage Notes (Signed)
c/o pain in left eye, states she accidentally poked her finger in her eye  Last pm.

## 2016-09-27 NOTE — ED Provider Notes (Signed)
[facility] Provider Note   CSN: [id number] Arrival date & time: 09/27/16  0422     History   Chief Complaint Chief Complaint  Patient presents with  . Foreign Body in Eye    HPI Abigail Morgan is a 26 y.o. female.  HPI Patient presents after sustaining an injury to her left eye. Patient was well prior to the event. Patient recalls accidentally sticking her finger into her left eye, overnight. This occurred several hours prior to ED arrival. Since that time she said pain, sharp, burning sensation in the left eye. No other complaints, no other injuries. Patient denies other medical issues. She is taking no medication since the event. There is some blurriness of vision, but no loss of vision.    Past Medical History:  Diagnosis Date  . Asthma   . No pertinent past medical history   . Preterm labor     Patient Active Problem List   Diagnosis Date Noted  . Sterilization 06/19/2013  . Pregnancy 06/18/2013  . Previous preterm delivery, antepartum 01/24/2013  . History of preterm premature rupture of membranes (PROM) in previous pregnancy, currently pregnant 01/24/2013  . Supervision of high-risk pregnancy 01/24/2013    Past Surgical History:  Procedure Laterality Date  . NO PAST SURGERIES    . TUBAL LIGATION Bilateral 06/19/2013   Procedure: POST PARTUM TUBAL LIGATION;  Surgeon: Willodean Rosenthal, MD;  Location: [facility];  Service: Gynecology;  Laterality: Bilateral;    OB History    Gravida Para Term Preterm AB Living   4 3 1 2 1 3    SAB TAB Ectopic Multiple Live Births   0 1 0 0 3       Home Medications    Prior to Admission medications   Medication Sig Start Date End Date Taking? Authorizing Provider  ibuprofen (ADVIL,MOTRIN) 600 MG tablet Take 1 tablet (600 mg total) by mouth every 6 (six) hours. 06/20/13   Arabella Merles, CNM  oxyCODONE-acetaminophen (PERCOCET/ROXICET) 5-325 MG per tablet Take 1-2 tablets by mouth every 4 (four) hours as  needed for severe pain (moderate - severe pain). 06/20/13   Arabella Merles, CNM  Prenatal Vit-Fe Fumarate-FA (PRENATAL MULTIVITAMIN) TABS tablet Take 1 tablet by mouth daily at 12 noon.    [provider]    Family History History reviewed. No pertinent family history.  Social History Social History  Substance Use Topics  . Smoking status: Former Smoker    Packs/day: 0.50    Years: 7.00    Types: Cigarettes    Quit date: 01/23/2013  . Smokeless tobacco: Never Used  . Alcohol use No     Allergies   Patient has no known allergies.   Review of Systems Review of Systems  Constitutional: Negative for fever.  HENT: Negative.   Eyes: Positive for pain, discharge, redness, itching and visual disturbance.  Gastrointestinal: Negative for nausea.  Musculoskeletal:       Negative aside from HPI  Skin:       Negative aside from HPI  Allergic/Immunologic: Negative for immunocompromised state.  Neurological: Negative for weakness.     Physical Exam Updated Vital Signs BP 104/70 (BP Location: Right Arm)   Pulse 63   Temp 98.4 F (36.9 C)   Resp 17   Ht 5' (1.524 m)   Wt 106 lb (48.1 kg)   LMP 09/11/2016   SpO2 98%   BMI 20.70 kg/m   Physical Exam  Constitutional: She is oriented to person, place, and  time. She appears well-developed and well-nourished.  HENT:  Head: Normocephalic and atraumatic.  Eyes: Lids are normal. Left eye exhibits discharge. No foreign body present in the left eye. Left conjunctiva is injected. Left eye exhibits normal extraocular motion and no nystagmus. Right pupil is reactive. Left pupil is reactive.  Slit lamp exam:      The left eye shows corneal abrasion.    Cardiovascular: Normal rate and regular rhythm.   Pulmonary/Chest: No respiratory distress. She has no wheezes.  Musculoskeletal: She exhibits no deformity.  Neurological: She is alert and oriented to person, place, and time. No cranial nerve deficit.  Skin: Skin is warm and  dry.  Psychiatric: She has a normal mood and affect.     ED Treatments / Results   Procedures Procedures (including critical care time)  Medications Ordered in ED Medications  fluorescein ophthalmic strip 1 strip (1 strip Left Eye Given 09/27/16 0740)  erythromycin ophthalmic ointment 1 application (1 application Left Eye Given 09/27/16 [id number])     Initial Impression / Assessment and Plan / ED Course  I have reviewed the triage vital signs and the nursing notes.  Pertinent labs & imaging results that were available during my care of the patient were reviewed by me and considered in my medical decision making (see chart for details).   Generally well young female presents with left eye pain after accidental, minor trauma. No evidence for globe rupture, no true vision loss, but some disruption secondary to pain, discharge. Patient found to have left corneal abrasion. Patient started on erythromycin ointment, discharged with ophthalmology follow-up, scheduled topical application of medication.  Final Clinical Impressions(s) / ED Diagnoses   Final diagnoses:  Abrasion of left cornea, initial encounter     Gerhard MunchLockwood, Vylette Strubel, MD 09/27/16 [phone]

## 2016-09-27 NOTE — Discharge Instructions (Signed)
Please be sure to use the provided erythromycin ointment 4 times daily for the next 3 days.  You may use Tylenol for additional pain control.  If your symptoms have not improved after 2 days before to follow-up with our ophthalmologist.  Return here for concerning changes in your condition.

## 2019-06-04 ENCOUNTER — Ambulatory Visit: Payer: Self-pay | Attending: Internal Medicine

## 2019-06-04 DIAGNOSIS — U071 COVID-19: Secondary | ICD-10-CM | POA: Insufficient documentation

## 2019-06-04 DIAGNOSIS — Z20822 Contact with and (suspected) exposure to covid-19: Secondary | ICD-10-CM

## 2019-06-06 LAB — NOVEL CORONAVIRUS, NAA: SARS-CoV-2, NAA: DETECTED — AB

## 2023-02-02 ENCOUNTER — Ambulatory Visit (HOSPITAL_COMMUNITY)
Admission: EM | Admit: 2023-02-02 | Discharge: 2023-02-02 | Disposition: A | Discharge status: Home / Self Care | Payer: Self-pay | Attending: Emergency Medicine | Admitting: Emergency Medicine

## 2023-02-02 ENCOUNTER — Encounter (HOSPITAL_COMMUNITY): Payer: Self-pay

## 2023-02-02 DIAGNOSIS — R220 Localized swelling, mass and lump, head: Secondary | ICD-10-CM

## 2023-02-02 MED ORDER — AMOXICILLIN-POT CLAVULANATE 875-125 MG PO TABS: 1.0000 | ORAL_TABLET | Freq: Two times a day (BID) | ORAL | 0 refills | Status: AC

## 2023-02-02 NOTE — ED Triage Notes (Signed)
Patient having swelling on the left side of the face with pain onset yesterday. No tooth pain. No known falls or injuries. History of dental abscess but this feels different.

## 2023-02-02 NOTE — ED Provider Notes (Signed)
[facility]    CSN: [id number] Arrival date & time: 02/02/23  0805     History   Chief Complaint Chief Complaint  Patient presents with   Facial Swelling    HPI Abigail Morgan is a 32 y.o. female.  Presents with left-sided facial swelling that began yesterday.  Pressure in her cheek with pain.  She denies any dental tenderness or gum swelling.  Has history of dental abscess but that felt different. She does report having some issues with her sinuses recently.  Not currently having congestion or runny nose.  No fever Denies swelling of lips or tongue.  No trouble breathing or swallowing  Past Medical History:  Diagnosis Date   Asthma    No pertinent past medical history    Preterm labor     Patient Active Problem List   Diagnosis Date Noted   Sterilization 06/19/2013   Pregnancy 06/18/2013   Previous preterm delivery, antepartum 01/24/2013   History of preterm premature rupture of membranes (PROM) in previous pregnancy, currently pregnant 01/24/2013   Supervision of high-risk pregnancy 01/24/2013    Past Surgical History:  Procedure Laterality Date   NO PAST SURGERIES     TUBAL LIGATION Bilateral 06/19/2013   Procedure: POST PARTUM TUBAL LIGATION;  Surgeon: Willodean Rosenthal, MD;  Location: [facility];  Service: Gynecology;  Laterality: Bilateral;    OB History     Gravida  4   Para  3   Term  1   Preterm  2   AB  1   Living  3      SAB  0   IAB  1   Ectopic  0   Multiple  0   Live Births  3            Home Medications    Prior to Admission medications   Medication Sig Start Date End Date Taking? Authorizing Provider  amoxicillin-clavulanate (AUGMENTIN) 875-125 MG tablet Take 1 tablet by mouth every 12 (twelve) hours for 7 days. 02/02/23 02/09/23 Yes Kamilla Hands, Ray Church    Family History History reviewed. No pertinent family history.  Social History Social History   Tobacco Use   Smoking status: Former    Current  packs/day: 0.00    Average packs/day: 0.5 packs/day for 7.0 years (3.5 ttl pk-yrs)    Types: Cigarettes    Start date: 01/23/2006    Quit date: 01/23/2013    Years since quitting: 10.0   Smokeless tobacco: Never  Substance Use Topics   Alcohol use: No   Drug use: No     Allergies   Patient has no known allergies.   Review of Systems Review of Systems As per HPI  Physical Exam Triage Vital Signs ED Triage Vitals  Encounter Vitals Group     BP 02/02/23 0816 117/79     Systolic BP Percentile --      Diastolic BP Percentile --      Pulse Rate 02/02/23 0816 69     Resp 02/02/23 0816 16     Temp 02/02/23 0816 98.3 F (36.8 C)     Temp Source 02/02/23 0816 Oral     SpO2 02/02/23 0816 97 %     Weight 02/02/23 0816 110 lb (49.9 kg)     Height 02/02/23 0816 5' (1.524 m)     Head Circumference --      Peak Flow --      Pain Score 02/02/23 0815 8     Pain  Loc --      Pain Education --      Exclude from Growth Chart --    No data found.  Updated Vital Signs BP 117/79 (BP Location: Left Arm)   Pulse 69   Temp 98.3 F (36.8 C) (Oral)   Resp 16   Ht 5' (1.524 m)   Wt 110 lb (49.9 kg)   LMP 01/29/2023 (Exact Date)   SpO2 97%   Breastfeeding No   BMI 21.48 kg/m   Physical Exam Vitals and nursing note reviewed.  Constitutional:      General: She is not in acute distress.    Appearance: Normal appearance.  HENT:     Head:     Jaw: There is normal jaw occlusion. No swelling or pain on movement.      Comments: Swelling of left face. Does not extend under the chin, jaw, or neck. Tender to palpation.    Nose: No congestion.     Mouth/Throat:     Mouth: Mucous membranes are moist. No angioedema.     Dentition: Abnormal dentition. Dental caries present. No dental tenderness, gingival swelling or gum lesions.     Pharynx: Oropharynx is clear. Uvula midline. No pharyngeal swelling or posterior oropharyngeal erythema.     Comments: Caries and abnormal dentition without dental  tenderness or gingival swelling. Clear phonation and tolerating secretions  Cardiovascular:     Rate and Rhythm: Normal rate and regular rhythm.     Heart sounds: Normal heart sounds.  Pulmonary:     Effort: Pulmonary effort is normal.     Breath sounds: Normal breath sounds.  Neurological:     Mental Status: She is alert and oriented to person, place, and time.     UC Treatments / Results  Labs (all labs ordered are listed, but only abnormal results are displayed) Labs Reviewed - No data to display  EKG  Radiology No results found.  Procedures Procedures  Medications Ordered in UC Medications - No data to display  Initial Impression / Assessment and Plan / UC Course  I have reviewed the triage vital signs and the nursing notes.  Pertinent labs & imaging results that were available during my care of the patient were reviewed by me and considered in my medical decision making (see chart for details).  Afebrile, overall well appearing with stable vitals Left sided facial swelling. Does not extend under the neck or chin, no concern for ludwig's angina or angioedema at this time. Consider sinus etiology. Cover for infection with augmentin BID x 7 days. Discussed ibuprofen, benadryl, cool or warm compress. Return and ED precautions discussed. Patient agreeable to plan, no questions at this time.   Final Clinical Impressions(s) / UC Diagnoses   Final diagnoses:  Facial swelling     Discharge Instructions      Please take medication as prescribed. Take with food to avoid upset stomach.  Use ibuprofen/tylenol for swelling and pain Try warm or cool compress - whichever is more comfortable  You can use benadryl at night to further reduce swelling  Return if no improvement after 3 days on the antibiotic Please go to the emergency department if symptoms worsen. Especially if you develop swelling of the neck, under the chin, of the lips or tongue, or have trouble breathing or  swallowing.     ED Prescriptions     Medication Sig Dispense Auth. Provider   amoxicillin-clavulanate (AUGMENTIN) 875-125 MG tablet Take 1 tablet by mouth every 12 (twelve)  hours for 7 days. 14 tablet Jaylin Benzel, Lurena Joiner, PA-C      PDMP not reviewed this encounter.   Almena Hokenson, Lurena Joiner, [place] 02/02/23 1013

## 2023-02-02 NOTE — Discharge Instructions (Signed)
Please take medication as prescribed. Take with food to avoid upset stomach.  Use ibuprofen/tylenol for swelling and pain Try warm or cool compress - whichever is more comfortable  You can use benadryl at night to further reduce swelling  Return if no improvement after 3 days on the antibiotic Please go to the emergency department if symptoms worsen. Especially if you develop swelling of the neck, under the chin, of the lips or tongue, or have trouble breathing or swallowing.

## 2023-07-08 ENCOUNTER — Ambulatory Visit
Admission: EM | Admit: 2023-07-08 | Discharge: 2023-07-08 | Disposition: A | Discharge status: Home / Self Care | Payer: Self-pay

## 2023-07-08 DIAGNOSIS — L509 Urticaria, unspecified: Secondary | ICD-10-CM

## 2023-07-08 MED ORDER — METHYLPREDNISOLONE ACETATE 80 MG/ML IJ SUSP
80.0000 mg | Freq: Once | INTRAMUSCULAR | Status: AC
  Administered 2023-07-08: 80 mg via INTRAMUSCULAR

## 2023-07-08 NOTE — ED Provider Notes (Signed)
[facility]    CSN: [id number] Arrival date & time: 07/08/23  1258      History   Chief Complaint Chief Complaint  Patient presents with   Rash    HPI Abigail Morgan is a 33 y.o. female.   Patient here today for evaluation of rash with itching "hives" that started about 4 days ago. Symptoms wax and wane and she has rash noted to her arms, legs and torso. She denies any shortness of breath or trouble swallowing. She has not had any improvement with benadryl. She denies any known exposures that might have triggered symptoms- she notes she used to have similar hives as a teenager.   The history is provided by the patient.  Rash Associated symptoms: no abdominal pain, no fever, no nausea, no shortness of breath and not vomiting     Past Medical History:  Diagnosis Date   Asthma    Preterm labor     Patient Active Problem List   Diagnosis Date Noted   Encounter for sterilization 06/19/2013   Pregnancy 06/18/2013   Previous preterm delivery, antepartum 01/24/2013   History of preterm premature rupture of membranes (PROM) in previous pregnancy, currently pregnant 01/24/2013   Supervision of high-risk pregnancy 01/24/2013    Past Surgical History:  Procedure Laterality Date   NO PAST SURGERIES     TUBAL LIGATION Bilateral 06/19/2013   Procedure: POST PARTUM TUBAL LIGATION;  Surgeon: Willodean Rosenthal, MD;  Location: [facility];  Service: Gynecology;  Laterality: Bilateral;    OB History     Gravida  4   Para  3   Term  1   Preterm  2   AB  1   Living  3      SAB  0   IAB  1   Ectopic  0   Multiple  0   Live Births  3            Home Medications    Prior to Admission medications   Medication Sig Start Date End Date Taking? Authorizing Provider  diphenhydrAMINE (BENADRYL) 25 mg capsule Take 50 mg by mouth every 6 (six) hours as needed for itching, allergies or sleep.   Yes [provider]    Family History History  reviewed. No pertinent family history.  Social History Social History   Tobacco Use   Smoking status: Former    Current packs/day: 0.00    Average packs/day: 0.5 packs/day for 7.0 years (3.5 ttl pk-yrs)    Types: Cigarettes    Start date: 01/23/2006    Quit date: 01/23/2013    Years since quitting: 10.4   Smokeless tobacco: Never  Vaping Use   Vaping status: Never Used  Substance Use Topics   Alcohol use: No   Drug use: No     Allergies   Patient has no known allergies.   Review of Systems Review of Systems  Constitutional:  Negative for chills and fever.  Eyes:  Negative for discharge and redness.  Respiratory:  Negative for shortness of breath.   Gastrointestinal:  Negative for abdominal pain, nausea and vomiting.  Skin:  Positive for rash.     Physical Exam Triage Vital Signs ED Triage Vitals  Encounter Vitals Group     BP 07/08/23 1311 100/63     Systolic BP Percentile --      Diastolic BP Percentile --      Pulse Rate 07/08/23 1311 65     Resp 07/08/23 1311  18     Temp 07/08/23 1311 98.1 F (36.7 C)     Temp Source 07/08/23 1311 Oral     SpO2 07/08/23 1311 98 %     Weight 07/08/23 1309 112 lb (50.8 kg)     Height 07/08/23 1309 5' (1.524 m)     Head Circumference --      Peak Flow --      Pain Score 07/08/23 1307 0     Pain Loc --      Pain Education --      Exclude from Growth Chart --    No data found.  Updated Vital Signs BP 100/63 (BP Location: Left Arm)   Pulse 65   Temp 98.1 F (36.7 C) (Oral)   Resp 18   Ht 5' (1.524 m)   Wt 112 lb (50.8 kg)   LMP 06/06/2023 (Exact Date)   SpO2 98%   BMI 21.87 kg/m   Visual Acuity Right Eye Distance:   Left Eye Distance:   Bilateral Distance:    Right Eye Near:   Left Eye Near:    Bilateral Near:     Physical Exam Vitals and nursing note reviewed.  Constitutional:      General: She is not in acute distress.    Appearance: Normal appearance. She is not ill-appearing.  HENT:     Head:  Normocephalic and atraumatic.  Eyes:     Conjunctiva/sclera: Conjunctivae normal.  Cardiovascular:     Rate and Rhythm: Normal rate.  Pulmonary:     Effort: Pulmonary effort is normal. No respiratory distress.  Skin:    Comments: Diffusely spread wheal like rash to bilateral arms, torso  Neurological:     Mental Status: She is alert.  Psychiatric:        Mood and Affect: Mood normal.        Behavior: Behavior normal.        Thought Content: Thought content normal.      UC Treatments / Results  Labs (all labs ordered are listed, but only abnormal results are displayed) Labs Reviewed - No data to display  EKG   Radiology No results found.  Procedures Procedures (including critical care time)  Medications Ordered in UC Medications  methylPREDNISolone acetate (DEPO-MEDROL) injection 80 mg (80 mg Intramuscular Given 07/08/23 1433)    Initial Impression / Assessment and Plan / UC Course  I have reviewed the triage vital signs and the nursing notes.  Pertinent labs & imaging results that were available during my care of the patient were reviewed by me and considered in my medical decision making (see chart for details).   Injection of depomedrol administered in office. Recommended benadryl at home and follow up in ED if symptoms worsen. Patient expresses understanding.    Final Clinical Impressions(s) / UC Diagnoses   Final diagnoses:  Hives   Discharge Instructions   None    ED Prescriptions   None    PDMP not reviewed this encounter.   Tomi Bamberger, PA-C 07/08/23 1444

## 2023-07-08 NOTE — ED Triage Notes (Signed)
"  I have hives all over my body, I first noticed about 4 days ago". "Nothing new that I was exposed to or consumed". No trouble breathing. No sob. No swelling. No PCP.

## 2023-07-09 ENCOUNTER — Other Ambulatory Visit: Payer: Self-pay

## 2023-07-09 ENCOUNTER — Emergency Department (HOSPITAL_COMMUNITY)
Admission: EM | Admit: 2023-07-09 | Discharge: 2023-07-09 | Disposition: A | Discharge status: Home / Self Care | Payer: Self-pay | Attending: Emergency Medicine | Admitting: Emergency Medicine

## 2023-07-09 DIAGNOSIS — R21 Rash and other nonspecific skin eruption: Secondary | ICD-10-CM | POA: Insufficient documentation

## 2023-07-09 DIAGNOSIS — J45909 Unspecified asthma, uncomplicated: Secondary | ICD-10-CM | POA: Insufficient documentation

## 2023-07-09 MED ORDER — PREDNISONE 20 MG PO TABS: 40.0000 mg | ORAL_TABLET | Freq: Every day | ORAL | 0 refills | Status: AC

## 2023-07-09 MED ORDER — METHYLPREDNISOLONE SODIUM SUCC 125 MG IJ SOLR
125.0000 mg | Freq: Once | INTRAMUSCULAR | Status: AC
  Administered 2023-07-09: 125 mg via INTRAMUSCULAR
  Filled 2023-07-09: qty 2

## 2023-07-09 MED ORDER — CETIRIZINE HCL 10 MG PO TABS
10.0000 mg | ORAL_TABLET | Freq: Once | ORAL | Status: AC
  Administered 2023-07-09: 10 mg via ORAL
  Filled 2023-07-09: qty 1

## 2023-07-09 MED ORDER — EPINEPHRINE 0.3 MG/0.3ML IJ SOAJ: 0.3000 mg | INTRAMUSCULAR | 0 refills | Status: AC | PRN

## 2023-07-09 MED ORDER — CETIRIZINE HCL 10 MG PO TABS: 10.0000 mg | ORAL_TABLET | Freq: Every day | ORAL | 0 refills | Status: AC | PRN

## 2023-07-09 NOTE — ED Triage Notes (Signed)
Pt has hives all over her body x 5 days. Pt was seen at [facility] and given a steroid shot and has been taking benadryl with no relief.

## 2023-07-09 NOTE — ED Provider Notes (Signed)
[facility] AT [facility] Provider Note   CSN: [id number] Arrival date & time: 07/09/23  1533     History  Chief Complaint  Patient presents with   Urticaria    Abigail Morgan is a 33 y.o. female.   Urticaria   33 year old female presents emergency department complaints of rash.  Rash present for the past 4 to 5 days.  States it began on her upper and lower extremities and now is on her chest as well as abdomen.  Denies any known changes in soaps, irritants, detergents, new pet exposure, new environments.  Was in the urgent care yesterday and given a shot of Solu-Medrol and told to continue to take Benadryl.  States that that shot significantly helped the rash especially on her chest and abdomen before returning again this morning.  Was not sent home with any corticosteroids.  Denies any difficulty breathing, feelings of throat closing in on her, chest pain, shortness of breath abdominal pain, nausea, vomiting.  Has continued to take Benadryl which is help with itching but has not helped with the rash.  Has also tried topical cortisone cream without improvement.  Presents emergency department for further assessment.  Patient denies any chronic medication use or any new medications in the outpatient setting.  Past medical history significant for asthma  Home Medications Prior to Admission medications   Medication Sig Start Date End Date Taking? Authorizing Provider  cetirizine (ZYRTEC ALLERGY) 10 MG tablet Take 1 tablet (10 mg total) by mouth daily as needed for allergies (itch). 07/09/23  Yes Sherian Maroon A, PA  EPINEPHrine 0.3 mg/0.3 mL IJ SOAJ injection Inject 0.3 mg into the muscle as needed for anaphylaxis. 07/09/23  Yes Sherian Maroon A, PA  predniSONE (DELTASONE) 20 MG tablet Take 2 tablets (40 mg total) by mouth daily with breakfast for 6 days. 07/10/23 07/16/23 Yes Sherian Maroon A, PA  diphenhydrAMINE (BENADRYL) 25 mg capsule Take 50 mg by mouth  every 6 (six) hours as needed for itching, allergies or sleep.    [provider]      Allergies    Patient has no known allergies.    Review of Systems   Review of Systems  All other systems reviewed and are negative.   Physical Exam Updated Vital Signs BP 122/75   Pulse 63   Temp 98.3 F (36.8 C) (Oral)   Resp 17   LMP 06/06/2023 (Exact Date)   SpO2 100%  Physical Exam Vitals and nursing note reviewed.  Constitutional:      General: She is not in acute distress.    Appearance: She is well-developed.  HENT:     Head: Normocephalic and atraumatic.  Eyes:     Conjunctiva/sclera: Conjunctivae normal.  Cardiovascular:     Rate and Rhythm: Normal rate and regular rhythm.     Heart sounds: No murmur heard. Pulmonary:     Effort: Pulmonary effort is normal. No respiratory distress.     Breath sounds: Normal breath sounds.  Abdominal:     Palpations: Abdomen is soft.     Tenderness: There is no abdominal tenderness.  Musculoskeletal:        General: No swelling.     Cervical back: Neck supple.  Skin:    General: Skin is warm and dry.     Capillary Refill: Capillary refill takes less than 2 seconds.     Comments: Raised wheal-like rashes on patient's bilateral upper, lower extremities with few sparse places on chest,  abdomen.  No involvement of the oropharynx/face.  Neurological:     Mental Status: She is alert.  Psychiatric:        Mood and Affect: Mood normal.     ED Results / Procedures / Treatments   Labs (all labs ordered are listed, but only abnormal results are displayed) Labs Reviewed - No data to display  EKG None  Radiology No results found.  Procedures Procedures    Medications Ordered in ED Medications  methylPREDNISolone sodium succinate (SOLU-MEDROL) 125 mg/2 mL injection 125 mg (125 mg Intramuscular Given 07/09/23 1645)  cetirizine (ZYRTEC) tablet 10 mg (10 mg Oral Given 07/09/23 1643)    ED Course/ Medical Decision Making/ A&P                                  Medical Decision Making Risk OTC drugs. Prescription drug management.   This patient presents to the ED for concern of rash, this involves an extensive number of treatment options, and is a complaint that carries with it a high risk of complications and morbidity.  The differential diagnosis includes urticaria, contact of otitis, SJS/TN, cellulitis, erysipelas, necrotizing infection, other   Co morbidities that complicate the patient evaluation  See HPI   Additional history obtained:  Additional history obtained from EMR External records from outside source obtained and reviewed including hospital records   Lab Tests:  N/a   Imaging Studies ordered:  N/a   Cardiac Monitoring: / EKG:  The patient was maintained on a cardiac monitor.  I personally viewed and interpreted the cardiac monitored which showed an underlying rhythm of: Sinus rhythm   Consultations Obtained:  N/a   Problem List / ED Course / Critical interventions / Medication management  Rash I ordered medication including Solu-Medrol, Zyrtec   Reevaluation of the patient after these medicines showed that the patient improved I have reviewed the patients home medicines and have made adjustments as needed   Social Determinants of Health:  Former cigarette use.  Denies illicit drug use.   Test / Admission - Considered:  Rash Vitals signs within normal range and stable throughout visit. 33 year old female presents emergency department complaint of rash.  Rash present for the past 4 to 5 days.  Has been taking Benadryl at home which has improved itch but still with continued rash.  Was seen in the urgent care yesterday and given a shot of Solu-Medrol which she states significantly helped her symptoms but was not sent home with any corticosteroids.  Reports continued rash.  On exam, patient with raised wheal-like rash patient's bilateral upper lower extremities, few sparse  spots on chest and abdomen.  Rash consistent with urticaria.  No clinical evidence of SJS/TN or secondary bacterial infectious process.  Treated with dose of IM Solu-Medrol, antihistamine.  Will place patient on prednisone in the outpatient setting recommend continued antihistamine therapy.  Follow-up with PCP recommended for reevaluation.  Patient also given EpiPen if symptoms concerning for anaphylaxis arise.  Treatment plan discussed at length with the patient and she acknowledged understanding was agreeable to said plan.  Patient overall well-appearing, afebrile in no acute distress. Worrisome signs and symptoms were discussed with the patient, and the patient acknowledged understanding to return to the ED if noticed. Patient was stable upon discharge.          Final Clinical Impression(s) / ED Diagnoses Final diagnoses:  Rash    Rx / DC  Orders ED Discharge Orders          Ordered    predniSONE (DELTASONE) 20 MG tablet  Daily with breakfast        07/09/23 1606    EPINEPHrine 0.3 mg/0.3 mL IJ SOAJ injection  As needed        07/09/23 1606    cetirizine (ZYRTEC ALLERGY) 10 MG tablet  Daily PRN        07/09/23 1606              Peter Garter, [place] 07/09/23 1727    Vanetta Mulders, MD 07/12/23 1104

## 2023-07-09 NOTE — Discharge Instructions (Signed)
As discussed, rash concern sent with allergic reaction.  Will continue steroids for the next several days to treat allergic reaction.  Recommend taking Zyrtec in the morning and Benadryl as needed at night.  He may get over-the-counter Benadryl cream as well as oat based lotions to help soothe the itch.  Also send an EpiPen if symptoms concerning for anaphylaxis occur.  Please not hesitate to return if the worrisome signs and symptoms we discussed become apparent.
